# Patient Record
Sex: Female | Born: 1960 | Race: Black or African American | Hispanic: No | Marital: Single | Smoking: Former smoker
Health system: Southern US, Community
[De-identification: ages and names within clinical notes are randomized; demographics above are authoritative.]

## PROBLEM LIST (undated history)

## (undated) DIAGNOSIS — Z973 Presence of spectacles and contact lenses: Secondary | ICD-10-CM

## (undated) DIAGNOSIS — T7840XA Allergy, unspecified, initial encounter: Secondary | ICD-10-CM

## (undated) DIAGNOSIS — Z86018 Personal history of other benign neoplasm: Secondary | ICD-10-CM

## (undated) DIAGNOSIS — E669 Obesity, unspecified: Secondary | ICD-10-CM

## (undated) DIAGNOSIS — Z972 Presence of dental prosthetic device (complete) (partial): Secondary | ICD-10-CM

## (undated) DIAGNOSIS — D649 Anemia, unspecified: Secondary | ICD-10-CM

## (undated) DIAGNOSIS — J45909 Unspecified asthma, uncomplicated: Secondary | ICD-10-CM

## (undated) DIAGNOSIS — Z789 Other specified health status: Secondary | ICD-10-CM

## (undated) HISTORY — DX: Presence of dental prosthetic device (complete) (partial): Z97.2

## (undated) HISTORY — DX: Personal history of other benign neoplasm: Z86.018

## (undated) HISTORY — DX: Unspecified asthma, uncomplicated: J45.909

## (undated) HISTORY — DX: Obesity, unspecified: E66.9

## (undated) HISTORY — PX: OTHER SURGICAL HISTORY: SHX169

## (undated) HISTORY — PX: TONSILECTOMY, ADENOIDECTOMY, BILATERAL MYRINGOTOMY AND TUBES: SHX2538

## (undated) HISTORY — DX: Allergy, unspecified, initial encounter: T78.40XA

## (undated) HISTORY — PX: TUBAL LIGATION: SHX77

## (undated) HISTORY — DX: Other specified health status: Z78.9

## (undated) HISTORY — DX: Presence of spectacles and contact lenses: Z97.3

## (undated) HISTORY — DX: Anemia, unspecified: D64.9

---

## 1983-12-10 DIAGNOSIS — Z789 Other specified health status: Secondary | ICD-10-CM

## 1983-12-10 HISTORY — DX: Other specified health status: Z78.9

## 1998-11-08 HISTORY — PX: MYOMECTOMY: SHX85

## 1999-09-21 ENCOUNTER — Other Ambulatory Visit: Admission: RE | Admit: 1999-09-21 | Discharge: 1999-09-21 | Payer: Self-pay | Admitting: Obstetrics & Gynecology

## 1999-10-18 ENCOUNTER — Ambulatory Visit (HOSPITAL_COMMUNITY): Admission: RE | Admit: 1999-10-18 | Discharge: 1999-10-18 | Payer: Self-pay | Admitting: Obstetrics & Gynecology

## 1999-10-18 ENCOUNTER — Encounter: Payer: Self-pay | Admitting: Obstetrics & Gynecology

## 2000-02-08 ENCOUNTER — Ambulatory Visit (HOSPITAL_COMMUNITY): Admission: RE | Admit: 2000-02-08 | Discharge: 2000-02-08 | Payer: Self-pay | Admitting: Gastroenterology

## 2000-10-22 ENCOUNTER — Other Ambulatory Visit: Admission: RE | Admit: 2000-10-22 | Discharge: 2000-10-22 | Payer: Self-pay | Admitting: Obstetrics and Gynecology

## 2000-10-31 ENCOUNTER — Encounter: Payer: Self-pay | Admitting: Obstetrics and Gynecology

## 2000-10-31 ENCOUNTER — Ambulatory Visit (HOSPITAL_COMMUNITY): Admission: RE | Admit: 2000-10-31 | Discharge: 2000-10-31 | Payer: Self-pay | Admitting: Obstetrics and Gynecology

## 2001-03-20 DIAGNOSIS — Z789 Other specified health status: Secondary | ICD-10-CM

## 2001-03-20 HISTORY — DX: Other specified health status: Z78.9

## 2001-10-22 ENCOUNTER — Other Ambulatory Visit: Admission: RE | Admit: 2001-10-22 | Discharge: 2001-10-22 | Payer: Self-pay | Admitting: Obstetrics and Gynecology

## 2001-11-20 ENCOUNTER — Encounter: Payer: Self-pay | Admitting: Obstetrics and Gynecology

## 2001-11-20 ENCOUNTER — Ambulatory Visit (HOSPITAL_COMMUNITY): Admission: RE | Admit: 2001-11-20 | Discharge: 2001-11-20 | Payer: Self-pay | Admitting: Obstetrics and Gynecology

## 2002-11-02 ENCOUNTER — Other Ambulatory Visit: Admission: RE | Admit: 2002-11-02 | Discharge: 2002-11-02 | Payer: Self-pay | Admitting: Obstetrics and Gynecology

## 2002-11-23 ENCOUNTER — Ambulatory Visit (HOSPITAL_COMMUNITY): Admission: RE | Admit: 2002-11-23 | Discharge: 2002-11-23 | Payer: Self-pay | Admitting: Obstetrics and Gynecology

## 2002-11-23 ENCOUNTER — Encounter: Payer: Self-pay | Admitting: Obstetrics and Gynecology

## 2003-11-30 ENCOUNTER — Other Ambulatory Visit: Admission: RE | Admit: 2003-11-30 | Discharge: 2003-11-30 | Payer: Self-pay | Admitting: Obstetrics and Gynecology

## 2003-12-08 ENCOUNTER — Ambulatory Visit (HOSPITAL_COMMUNITY): Admission: RE | Admit: 2003-12-08 | Discharge: 2003-12-08 | Payer: Self-pay | Admitting: Obstetrics and Gynecology

## 2004-12-04 ENCOUNTER — Other Ambulatory Visit: Admission: RE | Admit: 2004-12-04 | Discharge: 2004-12-04 | Payer: Self-pay | Admitting: Obstetrics and Gynecology

## 2004-12-17 ENCOUNTER — Ambulatory Visit (HOSPITAL_COMMUNITY): Admission: RE | Admit: 2004-12-17 | Discharge: 2004-12-17 | Payer: Self-pay | Admitting: Obstetrics and Gynecology

## 2005-12-04 ENCOUNTER — Other Ambulatory Visit: Admission: RE | Admit: 2005-12-04 | Discharge: 2005-12-04 | Payer: Self-pay | Admitting: Obstetrics and Gynecology

## 2005-12-24 ENCOUNTER — Ambulatory Visit (HOSPITAL_COMMUNITY): Admission: RE | Admit: 2005-12-24 | Discharge: 2005-12-24 | Payer: Self-pay | Admitting: Obstetrics and Gynecology

## 2006-01-17 DIAGNOSIS — Z789 Other specified health status: Secondary | ICD-10-CM

## 2006-01-17 HISTORY — DX: Other specified health status: Z78.9

## 2006-12-30 ENCOUNTER — Ambulatory Visit (HOSPITAL_COMMUNITY): Admission: RE | Admit: 2006-12-30 | Discharge: 2006-12-30 | Payer: Self-pay | Admitting: Obstetrics and Gynecology

## 2007-01-10 ENCOUNTER — Emergency Department (HOSPITAL_COMMUNITY): Admission: EM | Admit: 2007-01-10 | Discharge: 2007-01-10 | Payer: Self-pay | Admitting: Emergency Medicine

## 2007-01-13 ENCOUNTER — Encounter: Admission: RE | Admit: 2007-01-13 | Discharge: 2007-01-13 | Payer: Self-pay | Admitting: Obstetrics and Gynecology

## 2007-03-12 ENCOUNTER — Ambulatory Visit: Payer: Self-pay | Admitting: Family Medicine

## 2007-04-15 ENCOUNTER — Ambulatory Visit: Payer: Self-pay | Admitting: Family Medicine

## 2008-01-19 ENCOUNTER — Encounter: Admission: RE | Admit: 2008-01-19 | Discharge: 2008-01-19 | Payer: Self-pay | Admitting: Obstetrics and Gynecology

## 2008-04-08 ENCOUNTER — Encounter (INDEPENDENT_AMBULATORY_CARE_PROVIDER_SITE_OTHER): Payer: Self-pay | Admitting: Obstetrics and Gynecology

## 2008-04-08 ENCOUNTER — Ambulatory Visit (HOSPITAL_COMMUNITY): Admission: RE | Admit: 2008-04-08 | Discharge: 2008-04-08 | Payer: Self-pay | Admitting: Obstetrics and Gynecology

## 2009-01-20 ENCOUNTER — Encounter: Admission: RE | Admit: 2009-01-20 | Discharge: 2009-01-20 | Payer: Self-pay | Admitting: Obstetrics and Gynecology

## 2009-08-01 ENCOUNTER — Ambulatory Visit: Payer: Self-pay | Admitting: Family Medicine

## 2009-10-12 ENCOUNTER — Ambulatory Visit: Payer: Self-pay | Admitting: Family Medicine

## 2009-10-13 ENCOUNTER — Encounter: Admission: RE | Admit: 2009-10-13 | Discharge: 2009-10-13 | Payer: Self-pay | Admitting: Family Medicine

## 2010-01-23 ENCOUNTER — Encounter: Admission: RE | Admit: 2010-01-23 | Discharge: 2010-01-23 | Payer: Self-pay | Admitting: Obstetrics and Gynecology

## 2010-03-09 HISTORY — PX: COLONOSCOPY: SHX174

## 2010-03-26 LAB — HM COLONOSCOPY

## 2010-10-30 ENCOUNTER — Ambulatory Visit: Payer: Self-pay | Admitting: Family Medicine

## 2010-12-06 ENCOUNTER — Ambulatory Visit: Payer: Self-pay | Admitting: Family Medicine

## 2010-12-30 ENCOUNTER — Encounter: Payer: Self-pay | Admitting: Obstetrics and Gynecology

## 2010-12-31 ENCOUNTER — Other Ambulatory Visit: Payer: Self-pay | Admitting: Obstetrics and Gynecology

## 2010-12-31 DIAGNOSIS — Z1239 Encounter for other screening for malignant neoplasm of breast: Secondary | ICD-10-CM

## 2011-01-09 LAB — HM MAMMOGRAPHY: HM Mammogram: NORMAL

## 2011-01-29 ENCOUNTER — Ambulatory Visit
Admission: RE | Admit: 2011-01-29 | Discharge: 2011-01-29 | Disposition: A | Discharge status: Home / Self Care | Payer: BC Managed Care – PPO | Source: Ambulatory Visit | Attending: Obstetrics and Gynecology | Admitting: Obstetrics and Gynecology

## 2011-01-29 DIAGNOSIS — Z1239 Encounter for other screening for malignant neoplasm of breast: Secondary | ICD-10-CM

## 2011-04-08 ENCOUNTER — Encounter (INDEPENDENT_AMBULATORY_CARE_PROVIDER_SITE_OTHER): Payer: BC Managed Care – PPO | Admitting: Medical

## 2011-04-08 DIAGNOSIS — L989 Disorder of the skin and subcutaneous tissue, unspecified: Secondary | ICD-10-CM

## 2011-04-08 DIAGNOSIS — E669 Obesity, unspecified: Secondary | ICD-10-CM

## 2011-04-08 DIAGNOSIS — Z0000 Encounter for general adult medical examination without abnormal findings: Secondary | ICD-10-CM

## 2011-04-10 ENCOUNTER — Encounter: Payer: Self-pay | Admitting: Medical

## 2011-04-10 ENCOUNTER — Encounter: Payer: BC Managed Care – PPO | Admitting: *Deleted

## 2011-04-23 NOTE — Op Note (Signed)
[name], [name]              ACCOUNT NO.:  [id number]   MEDICAL RECORD NO.:  [id number]          PATIENT TYPE:  AMB   LOCATION:  SDC                           FACILITY:  [facility]   PHYSICIAN:  Naima A. Dillard, M.D. DATE OF BIRTH:  March 12, 1961   DATE OF PROCEDURE:  04/08/2008  DATE OF DISCHARGE:                               OPERATIVE REPORT   TIME SEEN:  1 p.m.   PREOPERATIVE DIAGNOSES:  1. Metrorrhagia.  2. Uterine masses.   POSTOPERATIVE DIAGNOSES:  1. Metrorrhagia.  2. Uterine fibroid, which is submucosal.  3. Endometrial polyp.   PROCEDURE:  1. D&C.  2. Hysteroscopy.  3. Polypectomy.  4. Hysteroscopic resection of fibroid.   SURGEON:  Naima A. Dillard, MD.   ASSISTANT:  None .   ANESTHESIA:  MAC and local.   FINDINGS:  A small submucosal fibroid in the anterior wall of the  uterus.  Four of these polyp on the patient's right side wall of the  uterus, abundant fluffy endometrium.   SPECIMENS:  Endometrial curettings, polyp, and fragments of uterine  fibroid sent to pathology.   ESTIMATED BLOOD LOSS:  Minimal.   COMPLICATIONS:  None.   The patient went to PACU in stable condition.   Hysteroscopic deficit was 270 for the fluid, but there was plenty on the  floor and underneath the patient at the end of the procedure.   PROCEDURE IN DETAIL:  The patient was taken to the operating room where  she was given MAC anesthesia, placed in dorsal lithotomy position,  prepped and draped in a normal sterile fashion.  A bivalve speculum was  placed into the vagina.  The anterior lip of the cervix was grasped with  a single-tooth tenaculum.  The cervix was infiltrated with 20 mL of 1%  lidocaine and used for cervical block.  Cervix was then dilated with  Pratt dilators up to 21.  The hysteroscope was placed into the uterine  cavity.  Both ostia were visualized and the findings noted above were  seen.  The cervix was further dilated up to 31.  The resectoscope was  placed  into the uterine cavity.  The broad-based polyp was first removed  using cut and sent to pathology.  Then, I was able to see the entire  fibroid, which was removed on cut with a single loop.  Then, a sharp  curettage was done.  A second look was done and there were no  perforations noted.  No further polyps.  All specimens were sent to  pathology.  All instruments were removed from the vagina and cervix.  The tenaculum site on the patient's left side was made hemostatic with  silver nitrate.  On the left, I used silver nitrate, but it continued to  bleed, so I used a figure-of-eight stitch of 2-0 Vicryl on the UR-6 to  stop the bleeding.  Sponge, lap, and needle counts were correct.  The  patient went to recovery room in stable condition.      Naima A. Normand Sloop, M.D.  Electronically Signed     NAD/MEDQ  D:  04/08/2008  T:  04/09/2008  Job:  [id number]

## 2011-04-23 NOTE — H&P (Signed)
[name], [name]              ACCOUNT NO.:  [id number]   MEDICAL RECORD NO.:  [id number]          PATIENT TYPE:  AMB   LOCATION:  SDC                           [facility]ILITY:  [facility]   PHYSICIAN:  Naima A. Dillard, M.D. DATE OF BIRTH:  13-May-1961   DATE OF ADMISSION:  04/08/2008  DATE OF DISCHARGE:                              HISTORY & PHYSICAL   The patient is a 50 year old female, gravida 3, para 2, who came in for  her annual and complained of having irregular bleeding in between her  periods.  The patient does have a history of fibroids.  She is not on  any contraception.  She denies any new medications, menopausal symptoms,  or vaginal discharge.  Occasional pelvic pain.  No abdominal pain and no  increased stress.  The patient had an endometrial biopsy, which was  significant for a polyp.  She did have a sonohysterogram, which showed a  uterus measuring 12 x 11 x 9 with several fibroids.  The sonohysterogram  showed 4 polyps noted:  2.1 cm, 1.6 cm, 2.2 cm, and 2.5 cm.  Endometrial  biopsy was benign.  The patient has decided to have a D&C, hysteroscopy,  removal of polyps, and just do observation for the fibroids.  The  patient denies having any bleeding disorders or thyroid disease.   PAST MEDICAL HISTORY:  As above.   PAST SURGICAL HISTORY:  Significant for tubal ligation and abdominal  myomectomy.   FAMILY HISTORY:  Significant for heart disease and high blood pressure  and diabetes in maternal grandfather.   SOCIAL HISTORY:  Negative for tobacco, alcohol, or drug use.   ALLERGIES:  The patient is allergic to SHELLFISH and ASPIRIN.   MEDICATIONS:  Include Ambien as needed and calcium.   REVIEW OF SYSTEMS:  CARDIOVASCULAR:  No heart palpitations.  MUSCULOSKELETAL:  No weakness.  GENITOURINARY:  Significant for  irregular bleeding.  ENDOCRINE:  No thyroid disorder.   PHYSICAL EXAMINATION:  The patient's blood pressure is 112/70, she  weighs 231 pounds, and her height is  5 feet 4-1/2 inches.  Pupils are equal.  Hearing is normal.  Throat is clear.  Thyroid is not  enlarged.  HEART:  Regular rate and rhythm.  LUNGS:  Clear to auscultation bilaterally.  ABDOMEN:  Nondistended and soft.  No masses or organomegaly.  BREASTS:  No masses, discharge, skin change, or nipple retraction  bilaterally.  EXTREMITIES:  No cyanosis, clubbing, or edema.  NEURO:  Within normal limits.  Vaginal exam within normal limits.  Cervix is nontender without any  lesions.  Uterus is 12-week size, but she cannot feel irregularity on  exam and nontender.  Adnexa has no masses.   PERTINENT LABORATORY DATA:  Pap smear is within normal limits.  Endometrial biopsy was benign with polyps.  Sonohysterogram is as above.   The patient has metrorrhagia.  The patient desires a dilation and  curettage and hysteroscopy with removal of polyps.  She desires  observation for fibroids.  All treatment for fibroids were reviewed with  the patient.  The patient understands the risks but not limited  to  bleeding, infection, and damage to internal organs such as bowel,  bladder, and major blood vessels.      Naima A. Normand Sloop, M.D.  Electronically Signed     NAD/MEDQ  D:  04/07/2008  T:  04/08/2008  Job:  [id number]

## 2011-04-26 NOTE — Procedures (Signed)
[facility]  Patient:    [name], [name]                     MRN: [id number] Proc. Date: 02/09/00 Adm. Date:  [id number] Attending:  Nelda Marseille CC:         Petra Kuba, M.D.             Ronnald Nian, M.D.                           Procedure Report  PROCEDURE:  Colonoscopy.  INDICATIONS:  Bright red blood, clots, and periodic guaiac positivity.  INFORMED CONSENT:  Consent was signed after risks, benefits, methods, and options were thoroughly discussed in the office.  MEDICATIONS USED:  Demerol 60 and Versed 6.  DESCRIPTION OF PROCEDURE:  Rectal inspection was pertinent for tiny external hemorrhoids.  Digital examination was negative.  The video pediatric colonoscope was inserted and very easily advanced around the colon to the cecum.  This did ot require any position changes, but it did require some left lower quadrant pressure. On insertion, no obvious abnormalities were seen, including no heme.  The cecum was identified by the appendiceal orifice and the ileocecal valve.  In fact, the scope was inserted a short ways into the terminal ileum, which was facilitated by rolling her on her back.  The scope was slowly withdrawn.  The prep was adequate. There was some liquid stool that required washing and suctioning, but on slow withdrawal through the colon, no abnormalities were seen, specifically, no polyps, sign of  colitis, diverticula, AVMs, or any heme.  Once back in the rectum, the scope was retroflexed, revealing some internal hemorrhoids.  The scope was straightened and re-advanced a short ways of the sigmoid.  Air was suctioned, and the scope was removed.  The patient tolerated the procedure well.  There were no obvious or immediate complications.  ENDOSCOPIC DIAGNOSES: 1. Internal greater than external hemorrhoids. 2. Otherwise within normal limits to the terminal ileum without any heme being ee.  PLAN:  Go  ahead and treat hemorrhoids with Anusol-HC suppositories.  Call p.r.n., and otherwise, follow up in 6-8 weeks to recheck symptoms and guaiac to make sure no further workup plans are needed. DD:  02/08/00 TD:  02/09/00 Job: [id number]

## 2011-05-13 ENCOUNTER — Other Ambulatory Visit (INDEPENDENT_AMBULATORY_CARE_PROVIDER_SITE_OTHER): Payer: BC Managed Care – PPO

## 2011-05-13 DIAGNOSIS — D649 Anemia, unspecified: Secondary | ICD-10-CM

## 2011-05-13 LAB — CBC WITH DIFFERENTIAL/PLATELET
Basophils Absolute: 0 10*3/uL (ref 0.0–0.1)
Basophils Relative: 0 % (ref 0–1)
Eosinophils Absolute: 0.3 10*3/uL (ref 0.0–0.7)
HCT: 38.3 % (ref 36.0–46.0)
Hemoglobin: 12.1 g/dL (ref 12.0–15.0)
MCHC: 31.6 g/dL (ref 30.0–36.0)
MCV: 77.8 fL — ABNORMAL LOW (ref 78.0–100.0)
Monocytes Relative: 10 % (ref 3–12)
Neutro Abs: 4.8 10*3/uL (ref 1.7–7.7)
Neutrophils Relative %: 59 % (ref 43–77)
Platelets: 243 10*3/uL (ref 150–400)

## 2011-05-14 ENCOUNTER — Telehealth: Payer: Self-pay | Admitting: *Deleted

## 2011-05-14 ENCOUNTER — Ambulatory Visit: Payer: BC Managed Care – PPO

## 2011-05-14 NOTE — Telephone Encounter (Addendum)
Message copied by Dorthula Perfect on Tue May 14, 2011 11:26 AM ------      Message from: Jac Canavan      Created: Tue May 14, 2011  9:52 AM       Blood count improved.  Lets continue the iron for at least the next 3-5 months.  We can repeat labs then.     Pt notified of lab results.  Will continue iron and will call back in 3-5 months to repeat labs.  CM, LPN

## 2011-10-15 ENCOUNTER — Encounter: Payer: Self-pay | Admitting: Family Medicine

## 2012-01-31 ENCOUNTER — Encounter: Payer: Self-pay | Admitting: Medical

## 2012-01-31 ENCOUNTER — Ambulatory Visit (INDEPENDENT_AMBULATORY_CARE_PROVIDER_SITE_OTHER): Payer: BC Managed Care – PPO | Admitting: Medical

## 2012-01-31 VITALS — BP 140/80 | HR 88 | Temp 98.1°F | Resp 16 | Wt 186.0 lb

## 2012-01-31 DIAGNOSIS — D649 Anemia, unspecified: Secondary | ICD-10-CM

## 2012-01-31 DIAGNOSIS — R062 Wheezing: Secondary | ICD-10-CM

## 2012-01-31 DIAGNOSIS — R05 Cough: Secondary | ICD-10-CM

## 2012-01-31 DIAGNOSIS — R059 Cough, unspecified: Secondary | ICD-10-CM

## 2012-01-31 LAB — CBC WITH DIFFERENTIAL/PLATELET
Basophils Relative: 0 % (ref 0–1)
Eosinophils Absolute: 0.2 10*3/uL (ref 0.0–0.7)
Eosinophils Relative: 2 % (ref 0–5)
HCT: 39 % (ref 36.0–46.0)
Monocytes Absolute: 0.7 10*3/uL (ref 0.1–1.0)
Monocytes Relative: 10 % (ref 3–12)
Neutro Abs: 4.1 10*3/uL (ref 1.7–7.7)
Neutrophils Relative %: 56 % (ref 43–77)
WBC: 7.3 10*3/uL (ref 4.0–10.5)

## 2012-01-31 MED ORDER — AZITHROMYCIN 250 MG PO TABS: ORAL_TABLET | ORAL | Status: AC

## 2012-01-31 MED ORDER — ALBUTEROL SULFATE HFA 108 (90 BASE) MCG/ACT IN AERS: 2.0000 | INHALATION_SPRAY | Freq: Four times a day (QID) | RESPIRATORY_TRACT | Status: DC | PRN

## 2012-01-31 NOTE — Progress Notes (Signed)
Subjective:  [name] is a 51 y.o. female who presents for 2-3 wk hx/o cough, cough that seems long and drawn out, wheezing at times.  Has used some Mucinex, Whiskey, and Tylenol without much improvement.  She is a former smoker, 10 pack year hx/o but stopped 10 years ago.  She denies COPD or asthma. She exercises 2 days/wk without DOE or dyspnea or CP.  She has lost weight and continues with [employer] program.  Denies chest pain, palpitations or edema.  Thinks this may be bronchitis.  Getting frequency long cough.  +sick contacts.  She did get flu shot this year.  She denies hx/o DVT/PE, no recent proceudre, surgery, long travel, not on HRT, no calve pain, no hempotysis.  No other aggravating or relieving factors.  No other c/o.   The following portions of the patient's history were reviewed and updated as appropriate: allergies, current medications, past family history, past medical history, past social history, past surgical history and problem list.  Past Medical History  Diagnosis Date  . Anemia   . Obesity   . History of uterine fibroid    ROS Gen: no fever, chills, sweats HEENT: no sore throat, ear pain, sinus pressure Heart: no CP, palpitations, edema Lungs: no SOB, +wheezing, worse when flat GI: no NVD GU: negative   Objective:   Filed Vitals:   01/31/12 1418  BP: 140/80  Pulse: 88  Temp: 98.1 F (36.7 C)  Resp: 16    General appearance: Alert, WD/WN, no distress, mildly ill appearing                             Skin: warm, no rash, no diaphoresis                           Head: no sinus tenderness                            Eyes: conjunctiva normal, corneas clear, PERRLA                            Ears: pearly TMs, external ear canals normal                          Nose: septum midline, turbinates swollen, with erythema and clear discharge             Mouth/throat: MMM, tongue normal, mild pharyngeal erythema                           Neck: supple, no  adenopathy, no thyromegaly, nontender, no JVD                          Heart: RRR, normal S1, S2, no murmurs                         Lungs: +bronchial breath sounds, +scattered rhonchi, no wheezes, no rales                Extremities: no edema, nontender     Assessment and Plan:   Encounter Diagnoses  Name Primary?  . Cough Yes  . Wheezing    Will treat for bronchitis.  Prescription given today for Azithromycin and Albuterol inhaler as below.  Discussed diagnosis and treatment of bronchitis.  Suggested symptomatic OTC remedies for cough and congestion.  Advised she call back with symptom update by early next week.  Call/return in 2-3 days if symptoms are worse or not improving. We will send CXR for overread.

## 2012-01-31 NOTE — Patient Instructions (Signed)
Rest, drink plenty of fluids over the weekend, begin the antibiotic, and use the inhaler 2 puffs 2-3 times daily, and consider OTC Mucinex DM or Coricidin HBP to help with cough/congestion.  If not better by early next week, or if worse, then call or return.   Acute Bronchitis Bronchitis is a problem of the air tubes leading to your lungs. Acute means the illness started quickly. In this condition, the lining of those tubes becomes puffy (swollen) and can leak fluid. This makes it harder for air to get in and out of your lungs. You may cough a lot. This is because the air tubes are narrow. Bronchitis is most often caused by a virus. Medicines that kill germs (antibiotics) may be needed with germ (bacteria) infections for people who:  Smoke.   Have lasting (chronic) lung problems.   Are elderly.  HOME CARE  Rest.   Drink enough water and fluids to keep the pee clear or pale yellow.   Only take medicine as told by your doctor.   Medicines may be prescribed that will open up the airways. This will help make breathing easier.   Bronchitis usually gets better on its own in a few days.  Recovery from some problems (symptoms) of bronchitis may be slow. You should start feeling a little better after 2 to 3 days. Coughing may last for 3 to 4 weeks. GET HELP RIGHT AWAY IF:  You or your child has a temperature by mouth above 101, not controlled by medicine.   Chills or chest pain develops.   You or your child develops very bad shortness of breath.   There is bloody saliva mixed with mucus (sputum).   You or your child throws up (vomits) often, loses too much fluid (dehydration), feels faint, or has a very bad headache.   You or your child does not improve after 1 week of treatment.  MAKE SURE YOU:   Understand these instructions.   Will watch this condition.   Will get help right away if you or your child is not doing well or gets worse.  Document Released: 05/13/2008 Document  Re-Released: 02/19/2010 [facility] Patient Information 2011 [place], [place].

## 2012-02-04 ENCOUNTER — Ambulatory Visit (INDEPENDENT_AMBULATORY_CARE_PROVIDER_SITE_OTHER): Payer: BC Managed Care – PPO | Admitting: Obstetrics and Gynecology

## 2012-02-04 ENCOUNTER — Other Ambulatory Visit: Payer: Self-pay | Admitting: Obstetrics and Gynecology

## 2012-02-04 DIAGNOSIS — Z01419 Encounter for gynecological examination (general) (routine) without abnormal findings: Secondary | ICD-10-CM

## 2012-02-04 DIAGNOSIS — D259 Leiomyoma of uterus, unspecified: Secondary | ICD-10-CM

## 2012-02-04 DIAGNOSIS — Z1231 Encounter for screening mammogram for malignant neoplasm of breast: Secondary | ICD-10-CM

## 2012-02-11 ENCOUNTER — Ambulatory Visit
Admission: RE | Admit: 2012-02-11 | Discharge: 2012-02-11 | Disposition: A | Discharge status: Home / Self Care | Payer: BC Managed Care – PPO | Source: Ambulatory Visit | Attending: Obstetrics and Gynecology | Admitting: Obstetrics and Gynecology

## 2012-02-11 DIAGNOSIS — Z1231 Encounter for screening mammogram for malignant neoplasm of breast: Secondary | ICD-10-CM

## 2012-03-03 ENCOUNTER — Other Ambulatory Visit: Payer: Self-pay | Admitting: Obstetrics and Gynecology

## 2012-03-16 ENCOUNTER — Encounter (HOSPITAL_COMMUNITY): Payer: Self-pay | Admitting: Pharmacist

## 2012-03-25 ENCOUNTER — Encounter (HOSPITAL_COMMUNITY)
Admission: RE | Admit: 2012-03-25 | Discharge: 2012-03-25 | Disposition: A | Discharge status: Home / Self Care | Payer: BC Managed Care – PPO | Source: Ambulatory Visit | Attending: Obstetrics and Gynecology | Admitting: Obstetrics and Gynecology

## 2012-03-25 ENCOUNTER — Encounter (HOSPITAL_COMMUNITY): Payer: Self-pay

## 2012-03-25 LAB — BASIC METABOLIC PANEL
BUN: 14 mg/dL (ref 6–23)
Calcium: 10 mg/dL (ref 8.4–10.5)
Creatinine, Ser: 0.62 mg/dL (ref 0.50–1.10)
GFR calc Af Amer: >90 mL/min (ref >90)
GFR calc non Af Amer: >90 mL/min (ref >90)
Potassium: 3.9 mEq/L (ref 3.5–5.1)

## 2012-03-25 LAB — SURGICAL PCR SCREEN: MRSA, PCR: NEGATIVE

## 2012-03-25 LAB — CBC
Hemoglobin: 13.1 g/dL (ref 12.0–15.0)
MCV: 83 fL (ref 78.0–100.0)
WBC: 7.7 10*3/uL (ref 4.0–10.5)

## 2012-03-25 NOTE — Patient Instructions (Addendum)
20 [name]  03/25/2012   Your procedure is scheduled on:  04/08/12  Enter through the Main Entrance of [facility] at 6 AM.  Pick up the phone at the desk and dial [phone].   Call this number if you have problems the morning of surgery: [phone]   Remember:   Do not eat food:After Midnight.  Do not drink clear liquids: After Midnight.  Take these medicines the morning of surgery with A SIP OF WATER: na   Do not wear jewelry, make-up or nail polish.  Do not wear lotions, powders, or perfumes. You may wear deodorant.  Do not shave 48 hours prior to surgery.  Do not bring valuables to the hospital.  Contacts, dentures or bridgework may not be worn into surgery.  Leave suitcase in the car. After surgery it may be brought to your room.  For patients admitted to the hospital, checkout time is 11:00 AM the day of discharge.   Patients discharged the day of surgery will not be allowed to drive home.  Name and phone number of your driver: na  Special Instructions: CHG Shower Use Special Wash: 1/2 bottle night before surgery and 1/2 bottle morning of surgery.   Please read over the following fact sheets that you were given: MRSA Information

## 2012-03-27 ENCOUNTER — Ambulatory Visit (INDEPENDENT_AMBULATORY_CARE_PROVIDER_SITE_OTHER): Payer: BC Managed Care – PPO | Admitting: Obstetrics and Gynecology

## 2012-03-27 ENCOUNTER — Encounter: Payer: Self-pay | Admitting: Obstetrics and Gynecology

## 2012-03-27 VITALS — BP 122/74 | HR 74 | Temp 97.8°F | Resp 16 | Ht 64.5 in | Wt 184.0 lb

## 2012-03-27 DIAGNOSIS — D259 Leiomyoma of uterus, unspecified: Secondary | ICD-10-CM

## 2012-03-27 DIAGNOSIS — N920 Excessive and frequent menstruation with regular cycle: Secondary | ICD-10-CM

## 2012-03-27 DIAGNOSIS — E669 Obesity, unspecified: Secondary | ICD-10-CM

## 2012-03-27 DIAGNOSIS — Z8742 Personal history of other diseases of the female genital tract: Secondary | ICD-10-CM

## 2012-03-27 DIAGNOSIS — R0602 Shortness of breath: Secondary | ICD-10-CM

## 2012-03-27 DIAGNOSIS — Z86018 Personal history of other benign neoplasm: Secondary | ICD-10-CM

## 2012-03-27 DIAGNOSIS — Z01818 Encounter for other preprocedural examination: Secondary | ICD-10-CM

## 2012-03-27 DIAGNOSIS — D649 Anemia, unspecified: Secondary | ICD-10-CM

## 2012-03-27 DIAGNOSIS — D219 Benign neoplasm of connective and other soft tissue, unspecified: Secondary | ICD-10-CM

## 2012-03-27 DIAGNOSIS — N840 Polyp of corpus uteri: Secondary | ICD-10-CM

## 2012-03-27 NOTE — Progress Notes (Deleted)
[name] is a 51 y.o. female [phone] who presents for Robot Assisted Myomectomy with hysteroscopic resection of endometrial polyps and Novasure Endometrial Ablation because of menorrhagia, endometrial polyps and symptomatic uterine fibroids.  Patient has a long h/o fibroids and underwent a myomectomy in 1999.  In recent years patient has begun to bleed 15 days out of the month requiring the change of an overnight pad along with tissue paper every 3 hours.  Patient admits to occasionally soling clothing & linen. She goes on to report cramping rated as 9/10 on a 10 point pain scale that is decreased to 4/10 with Tylenol.  Sometimes after her 15 day bleed she will experience 3 days of spotting 9 days later. She is sometimes lightheaded but denies vaginitis sx, uti sx, or changes in BM.  U/S with SHG results from 11/10/2011 showed: uterus measuring 11.16 x 8.23 x 7.42 cm; Five fibroids: posterior subserosal-6.5 x 5.6 x 7.1 cm, posterior subserosal-3.7 x 3.4 x 4.4cm, anterior intramural-1.8 x 2.1 x 2.1cm, posterior intramural-2.2 x 2.0 x 2.6cm, and fundal-2.5 x 2.8 x 2.4cm. SHG-complex endometrium, endometrial polyp-1.3 x 0.67cm, there is a suspected mass in the inferior portion of the endometrium ? fibroid-5.2 x 1.3cm, per 3D rendering. Right ovary: 3.32 x 1.49 x 1.62cm and Left ovary: 2.54 x 1.43 x 1.58cm.  Past Medical History  OB History: G3P3003 {Delivery:20192}  GYN History: menarche    LMP    Contracepton abstinenceno method   The patient denies history of sexually transmitted disease.  history of laser surgery on cervix 1984 Last PAP smear  Medical History:   Surgical History: Denies problems with anesthesia or history of blood transfusions  Family History:  Social History:   *** :: {Desc; social history:32080}   @Medications @  @allergies @  ROS:{dnt review of systems (ROS):20055}  Physical Exam    BP 122/74  Pulse 74  Temp 97.8 F (36.6 C)  Resp 16  Ht 5' 4.5" (1.638 m)   Wt 184 lb (83.462 kg)  BMI 31.10 kg/m2  LMP 03/23/2012 weight  .[id number]   BMI Body mass index is 31.10 kg/(m^2).  Neck: {EXAM; NECK PED:18560} Lungs: {Exam; lungs brief:12271} Abdomen: {Exam; abdomen:5259::"abdomen is soft without significant tenderness, masses, organomegaly or guarding"} Extremities: Pelvic:uterus 12-14 weeks size, irregular, tender, cervix displaced to right, vagina with scant blood & atrophic and adnexae without tenderness or masses   Assesment: Menorrhagia Uterine Fibroids Endometrial Polyps  Disposition:  A discussion was held with patient regarding the indication for her procedure(s) along with the risks, which include but are not limited to: reaction to anesthesia, damage to adjacent organs, infection and excessive bleeding and possible need for an open abdominal incision.  Patient also understands that the robotic approach to her surgery requires more time than that of an open abdominal incision, that she will experience transient post operative facial edema, that her hospital stay is expected to be 0-2 days and return to regular activities in 2-3 weeks (wtih the exception of intercourse which will be 6 weeks).  The patient was given the Miralax bowel prep to be completed 24 hours prior to her surgery.  The patient verbalized understanding of these risks and pre-operative instructions and has consented to proceed with a Robot Assisted Myomectomy, Hysteroscopy with D & C followed by Novasure Endometrial Ablation at [facility] [place], Apr 08, 2012 at 7:30 a.m.  CSN# [id number]   Awa Bachicha J. Lowell Guitar, PA-C  for Dr. Estanislado Pandy

## 2012-03-27 NOTE — Progress Notes (Signed)
[name] is a 51 y.o. single African-American female G3 P2-0-1-3 who presents for a robot assisted myomectomy with hysteroscopic resection of endometrial polyps and Novasure endometrial ablation because of menorrhagia, endometrial polyps and symptomatic uterine fibroids. Patient has a long history of fibroids and underwent a myomectomy in 1999. In recent years the patient began bleeding 15 days out of the month requiring the change of an overnight pad, along with tissue paper, every 3 hours. Patient admits to occasionally soiling clothing & linen. She goes on to report cramping rated as 9/10 (on a 10 point pain scale) that is decreased to 4/10 with Tylenol. Occasionally, nine days after her 15 day bleed, she will experience 3 days of spotting. She admits to episodes of lightheadedness, but denies vaginitis symptoms, uti symptoms, or changes in BM. Patient is not currently sexually active.   U/S with SHG results from 11/10/2011 showed: uterus measuring 11.16 x 8.23 x 7.42 cm; Five fibroids: posterior subserosal-6.5 x 5.6 x 7.1 cm, posterior subserosal-3.7 x 3.4 x 4.4cm, anterior intramural-1.8 x 2.1 x 2.1cm, posterior intramural-2.2 x 2.0 x 2.6cm, and fundal-2.5 x 2.8 x 2.4cm.  SHG-complex endometrium, endometrial polyp-1.3 x 0.67cm, there is a suspected mass in the inferior portion of the endometrium ? fibroid-5.2 x 1.3cm, per 3D rendering.  Right ovary: 3.32 x 1.49 x 1.62cm and Left ovary: 2.54 x 1.43 x 1.58cm.   Endometrial biopsy (11/2011) did not demonstrate any hyperplasia, atypia or malignancy but mixed endometrial/endocervical polyp. Though patient was offered medical management for her symptoms, she has decided to proceed with a repeat myomectomy and endometrial ablation.   Past Medical History  OB History: G3 P 2-0-1-3 SVD 1985 and 1987   GYN History: menarche 51 years old LMP 03/23/12 Contracepton-abstinence, The patient denies history of sexually transmitted disease., has a history of  laser surgery on cervix in 1984 with normalt PAP smears since Last PAP smear 01/2012.   Medical History: anemia, insomnia, rectal bleeding with benign colon polyps    Surgical History: 1972 Tonsillectomy; 1987-Bilateral Tubal Ligation; 1999-Myomectomy; 2009-Hysteroscopy with D & C for polyps. Denies problems with anesthesia or history of blood transfusions   Family History: Hypertension, Diabetes, End-Stage Renal Disease, Breast Cancer (maternal grand-mother age 78), Cardio-Vascular Disease and Seizures   Social History: Patient is single and works as a counselor at [employer]   Habits: former cigarette smoker (quit 20 years ago), occasionally uses alcohol but denies illicit drug use.   Medications:  Multivitamins 1/day  Calcium 950 mg 1day  Ferrous Sulfate 325 mg twice daily   Allergies: ASA & Shellfish cause hives. Denies sensitivity to peanuts, soy,or latex   ROS:Patient reports no health concerns. wears glasses, has partial dental plates in upper and lower jaws, denies chest pain, shortness of breath, headache, vision changes, difficulty swallowing, nausea, vomiting, diarrhea, arthralgias or myalgias.   Physical Exam   BP 122/74  Pulse 74  Temp 97.8 F (36.6 C)  Resp 16  Ht 5' 4.5" (1.638 m)  Wt 184 lb (83.462 kg)  BMI 31.10 kg/m2  LMP 03/23/2012  BMI Body mass index is 31.10 kg/(m^2).  Neck: Neck supple. No adenopathy. Thyroid symmetric, normal size  Heart: Regular rate and rhythm.  Lungs: clear  Abdomen: abdomen is soft without tenderness or organomegaly; firm palpable mass from the pelvis to approximately 2 fingers above symphysis  Extremities: no clubbing cyanosis or edema  Pelvic:uterus 12-14 weeks size, irregular, tender, cervix displaced to right, vagina with scant blood &   atrophic and adnexae without tenderness or masses   Assesment:  1. Menorrhagia  2. Uterine Fibroids  3. Endometrial Polyps   Disposition: A discussion was held with patient  regarding the indication for her procedure(s) along with the risks, which include, but are not limited to: reaction to anesthesia, damage to adjacent organs, infection and excessive bleeding and possible need for an open abdominal incision. Patient also understands that the robotic approach to her surgery requires more time than that of an open abdominal incision, that she will experience transient post-operative facial edema, that her hospital stay is expected to be 0-2 days with return to regular activities in 2-3 weeks (except for intercourse which will be 6 weeks). The patient was given the Miralax bowel prep to be completed 24 hours prior to her surgery. The patient verbalized understanding of these risks and pre-operative instructions and has consented to proceed with a robot assisted myomectomy, hysteroscopy with D & C followed by Novasure endometrial ablation at [facility] [place], Apr 08, 2012 at 7:30 a.m.   CSN# [id number]   Icarus Partch J. Ronald Londo, PA-C for Dr. Rivard   

## 2012-03-27 NOTE — Patient Instructions (Signed)
Follow instructions given for your Miralax Bowel Prep starting the day before your surgery.

## 2012-03-27 NOTE — Progress Notes (Signed)
See pre-op visit with Henreitta Leber PA-C.

## 2012-03-27 NOTE — H&P (Deleted)
[name] is a 51 y.o. female [phone] who presents for a robot assisted myomectomy with hysteroscopic resection of endometrial polyps and Novasure endometrial ablation because of menorrhagia, endometrial polyps and symptomatic uterine fibroids.  Patient has a long h/o fibroids and underwent a myomectomy in 1999.  In recent years patient has begun to bleed 15 days out of the month requiring the change of an overnight pad along with tissue paper every 3 hours.  Patient admits to occasionally soling clothing & linen. She goes on to report cramping rated as 9/10 on a 10 point pain scale that is decreased to 4/10 with Tylenol.  Sometimes after her 15 day bleed she will experience 3 days of spotting 9 days later. She is sometimes lightheaded but denies vaginitis sx, uti sx, or changes in BM. Patient is not currently sexually active.  U/S with SHG results from 11/10/2011 showed: uterus measuring 11.16 x 8.23 x 7.42 cm; Five fibroids: posterior subserosal-6.5 x 5.6 x 7.1 cm, posterior subserosal-3.7 x 3.4 x 4.4cm, anterior intramural-1.8 x 2.1 x 2.1cm, posterior intramural-2.2 x 2.0 x 2.6cm, and fundal-2.5 x 2.8 x 2.4cm. SHG-complex endometrium, endometrial polyp-1.3 x 0.67cm, there is a suspected mass in the inferior portion of the endometrium ? fibroid-5.2 x 1.3cm, per 3D rendering. Right ovary: 3.32 x 1.49 x 1.62cm and Left ovary: 2.54 x 1.43 x 1.58cm.  Endometrial biopsy (11/2011) did not demonstrate any hyperplasia, atypia or malignancy but mixed endometrial/endocervical polyp.  Though patient was offered medical management for her symptoms, she has decided to proceed with a repeat myomectomy and endometrial ablation.   Past Medical History  OB History: G3  P  2-0-1-3   SVD  1985 and 1987  GYN History: menarche  51 years old   LMP 03/23/12    Contracepton-abstinence,    The patient denies history of sexually transmitted disease., has a  history of laser surgery on cervix in 1984 but PAP smears have  been normal since Last PAP smear 01/2012  Medical History: anemia, insomnia, rectal bleeding with benign colon polyps  Surgical History:  1972- Tonsillectomy; 1987-Bilateral Tubal Ligation; 1999-Myomectomy; 2009-Hysteroscopy with D & C for polyps. 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  Medications: Multivitamins 1/day Calcium 950 mg  1day Ferrous Sulfate 325 mg twice daily  Allergies:  ASA & Shellfish cause hives.  Denies sensitivity to peanuts, soy,or latex  [id number] reports no health concerns.  Physical Exam    BP 122/74  Pulse 74  Temp 97.8 F (36.6 C)  Resp 16  Ht 5' 4.5" (1.638 m)  Wt 184 lb (83.462 kg)  BMI 31.10 kg/m2  LMP 03/23/2012 weight  .[id number]   BMI Body mass index is 31.10 kg/(m^2).  Neck: Neck supple. No adenopathy. Thyroid symmetric, normal size. Lungs: clear Abdomen: abdomen is soft without tenderness or organomegaly; firm palpable mass from the pelvis to approximately 2  fingers above symphysis Extremities: no clubbing cyanosis or edema Pelvic:uterus 12-14 weeks size, irregular, tender, cervix displaced to right, vagina with scant blood & atrophic and adnexae without tenderness or masses  Assesment: 1. Menorrhagia 2. Uterine Fibroids 3. Endometrial Polyps  Disposition:  A discussion was held with  patient regarding the indication for her procedure(s) along with the risks, which include, but are not limited to: reaction to anesthesia, damage to adjacent organs, infection and excessive bleeding and possible need for an open abdominal incision.  Patient also understands that the  robotic approach to her surgery requires more time than that of an open abdominal incision, that she will experience transient post-operative facial edema, that her hospital stay is expected to be 0-2 days and return to regular activities in 2-3 weeks (wtih the exception of intercourse which will be 6 weeks).  The patient was given the Miralax bowel prep to be completed 24 hours prior to her surgery.  The patient verbalized understanding of these risks and pre-operative instructions and has consented to proceed with a robot assisted myomectomy, hysteroscopy with D & C followed by Novasure endometrial ablation at [facility] at [place], Apr 08, 2012 at 7:30 a.m.  CSN# [id number]   Quintell Bonnin J. Lowell Guitar, PA-C  for Dr. Estanislado Pandy

## 2012-03-29 NOTE — Progress Notes (Signed)
[name] is a 51 y.o. single African-American female G3 P2-0-1-3 who presents for a robot assisted myomectomy with hysteroscopic resection of endometrial polyps and Novasure endometrial ablation because of menorrhagia, endometrial polyps and symptomatic uterine fibroids. Patient has a long history of fibroids and underwent a myomectomy in 1999. In recent years the patient began bleeding 15 days out of the month requiring the change of an overnight pad, along with tissue paper, every 3 hours. Patient admits to occasionally soiling clothing & linen. She goes on to report cramping rated as 9/10 (on a 10 point pain scale) that is decreased to 4/10 with Tylenol. Occasionally, nine days after her 15 day bleed, she will experience 3 days of spotting. She admits to episodes of lightheadedness, but denies vaginitis symptoms, uti symptoms, or changes in BM. Patient is not currently sexually active.   U/S with SHG results from 11/10/2011 showed: uterus measuring 11.16 x 8.23 x 7.42 cm; Five fibroids: posterior subserosal-6.5 x 5.6 x 7.1 cm, posterior subserosal-3.7 x 3.4 x 4.4cm, anterior intramural-1.8 x 2.1 x 2.1cm, posterior intramural-2.2 x 2.0 x 2.6cm, and fundal-2.5 x 2.8 x 2.4cm.  SHG-complex endometrium, endometrial polyp-1.3 x 0.67cm, there is a suspected mass in the inferior portion of the endometrium ? fibroid-5.2 x 1.3cm, per 3D rendering.  Right ovary: 3.32 x 1.49 x 1.62cm and Left ovary: 2.54 x 1.43 x 1.58cm.   Endometrial biopsy (11/2011) did not demonstrate any hyperplasia, atypia or malignancy but mixed endometrial/endocervical polyp. Though patient was offered medical management for her symptoms, she has decided to proceed with a repeat myomectomy and endometrial ablation.   Past Medical History  OB History: G3 P 2-0-1-3 SVD 1985 and 1987   GYN History: menarche 51 years old LMP 03/23/12 Contracepton-abstinence, The patient denies history of sexually transmitted disease., has a history of  laser surgery on cervix in 1984 with normalt PAP smears since Last PAP smear 01/2012.   Medical History: anemia, insomnia, rectal bleeding with benign colon polyps    Surgical History: 1972 Tonsillectomy; 1987-Bilateral Tubal Ligation; 1999-Myomectomy; 2009-Hysteroscopy with D & C for polyps. Denies problems with anesthesia or history of blood transfusions   Family History: Hypertension, Diabetes, End-Stage Renal Disease, Breast Cancer (maternal grand-mother age 78), Cardio-Vascular Disease and Seizures   Social History: Patient is single and works as a counselor at [employer]   Habits: former cigarette smoker (quit 20 years ago), occasionally uses alcohol but denies illicit drug use.   Medications:  Multivitamins 1/day  Calcium 950 mg 1day  Ferrous Sulfate 325 mg twice daily   Allergies: ASA & Shellfish cause hives. Denies sensitivity to peanuts, soy,or latex   ROS:Patient reports no health concerns. wears glasses, has partial dental plates in upper and lower jaws, denies chest pain, shortness of breath, headache, vision changes, difficulty swallowing, nausea, vomiting, diarrhea, arthralgias or myalgias.   Physical Exam   BP 122/74  Pulse 74  Temp 97.8 F (36.6 C)  Resp 16  Ht 5' 4.5" (1.638 m)  Wt 184 lb (83.462 kg)  BMI 31.10 kg/m2  LMP 03/23/2012  BMI Body mass index is 31.10 kg/(m^2).  Neck: Neck supple. No adenopathy. Thyroid symmetric, normal size  Heart: Regular rate and rhythm.  Lungs: clear  Abdomen: abdomen is soft without tenderness or organomegaly; firm palpable mass from the pelvis to approximately 2 fingers above symphysis  Extremities: no clubbing cyanosis or edema  Pelvic:uterus 12-14 weeks size, irregular, tender, cervix displaced to right, vagina with scant blood &   atrophic and adnexae without tenderness or masses   Assesment:  1. Menorrhagia  2. Uterine Fibroids  3. Endometrial Polyps   Disposition: A discussion was held with patient  regarding the indication for her procedure(s) along with the risks, which include, but are not limited to: reaction to anesthesia, damage to adjacent organs, infection and excessive bleeding and possible need for an open abdominal incision. Patient also understands that the robotic approach to her surgery requires more time than that of an open abdominal incision, that she will experience transient post-operative facial edema, that her hospital stay is expected to be 0-2 days with return to regular activities in 2-3 weeks (except for intercourse which will be 6 weeks). The patient was given the Miralax bowel prep to be completed 24 hours prior to her surgery. The patient verbalized understanding of these risks and pre-operative instructions and has consented to proceed with a robot assisted myomectomy, hysteroscopy with D & C followed by Novasure endometrial ablation at [facility] [place], Apr 08, 2012 at 7:30 a.m.   CSN# [id number]   Jaecob Lowden J. Dj Senteno, PA-C for Dr. Rivard   

## 2012-03-29 NOTE — H&P (Signed)
[name] is a 51 y.o. single African-American female G3 P2-0-1-3 who presents for a robot assisted myomectomy with hysteroscopic resection of endometrial polyps and Novasure endometrial ablation because of menorrhagia, endometrial polyps and symptomatic uterine fibroids. Patient has a long history of fibroids and underwent a myomectomy in 1999. In recent years the patient began bleeding 15 days out of the month requiring the change of an overnight pad, along with tissue paper, every 3 hours. Patient admits to occasionally soiling clothing & linen. She goes on to report cramping rated as 9/10 (on a 10 point pain scale) that is decreased to 4/10 with Tylenol. Occasionally, nine days after her 15 day bleed, she will experience 3 days of spotting. She admits to episodes of lightheadedness, but denies vaginitis symptoms, uti symptoms, or changes in BM. Patient is not currently sexually active.   U/S with SHG results from 11/10/2011 showed: uterus measuring 11.16 x 8.23 x 7.42 cm; Five fibroids: posterior subserosal-6.5 x 5.6 x 7.1 cm, posterior subserosal-3.7 x 3.4 x 4.4cm, anterior intramural-1.8 x 2.1 x 2.1cm, posterior intramural-2.2 x 2.0 x 2.6cm, and fundal-2.5 x 2.8 x 2.4cm.  SHG-complex endometrium, endometrial polyp-1.3 x 0.67cm, there is a suspected mass in the inferior portion of the endometrium ? fibroid-5.2 x 1.3cm, per 3D rendering.  Right ovary: 3.32 x 1.49 x 1.62cm and Left ovary: 2.54 x 1.43 x 1.58cm.   Endometrial biopsy (11/2011) did not demonstrate any hyperplasia, atypia or malignancy but mixed endometrial/endocervical polyp. Though patient was offered medical management for her symptoms, she has decided to proceed with a repeat myomectomy and endometrial ablation.   Past Medical History  OB History: G3 P 2-0-1-3 SVD 1985 and 1987   GYN History: menarche 51 years old LMP 03/23/12 Contracepton-abstinence, The patient denies history of sexually transmitted disease., has a history of  laser surgery on cervix in 1984 with normalt PAP smears since Last PAP smear 01/2012.   Medical History: anemia, insomnia, rectal bleeding with benign colon polyps    Surgical History: 1972 Tonsillectomy; 1987-Bilateral Tubal Ligation; 1999-Myomectomy; 2009-Hysteroscopy with D & C for polyps. 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   Medications:  Multivitamins 1/day  Calcium 950 mg 1day  Ferrous Sulfate 325 mg twice daily   Allergies: ASA & Shellfish cause hives. Denies sensitivity to peanuts, soy,or latex   [id number] reports no health concerns. wears glasses, has partial dental plates in upper and lower jaws, denies chest pain, shortness of breath, headache, vision changes, difficulty swallowing, nausea, vomiting, diarrhea, arthralgias or myalgias.   Physical Exam   BP 122/74  Pulse 74  Temp 97.8 F (36.6 C)  Resp 16  Ht 5' 4.5" (1.638 m)  Wt 184 lb (83.462 kg)  BMI 31.10 kg/m2  LMP 03/23/2012  BMI Body mass index is 31.10 kg/(m^2).  Neck: Neck supple. No adenopathy. Thyroid symmetric, normal size  Heart: Regular rate and rhythm.  Lungs: clear  Abdomen: abdomen is soft without tenderness or organomegaly; firm palpable mass from the pelvis to approximately 2 fingers above symphysis  Extremities: no clubbing cyanosis or edema  Pelvic:uterus 12-14 weeks size, irregular, tender, cervix displaced to right, vagina with scant blood &  atrophic and adnexae without tenderness or masses   Assesment:  1. Menorrhagia  2. Uterine Fibroids  3. Endometrial Polyps   Disposition: A discussion was held with patient  regarding the indication for her procedure(s) along with the risks, which include, but are not limited to: reaction to anesthesia, damage to adjacent organs, infection and excessive bleeding and possible need for an open abdominal incision. Patient also understands that the robotic approach to her surgery requires more time than that of an open abdominal incision, that she will experience transient post-operative facial edema, that her hospital stay is expected to be 0-2 days with return to regular activities in 2-3 weeks (except for intercourse which will be 6 weeks). The patient was given the Miralax bowel prep to be completed 24 hours prior to her surgery. The patient verbalized understanding of these risks and pre-operative instructions and has consented to proceed with a robot assisted myomectomy, hysteroscopy with D & C followed by Novasure endometrial ablation at [facility] at [place], Apr 08, 2012 at 7:30 a.m.   CSN# [id number]   Kyvon Hu J. Lowell Guitar, PA-C for Dr. Estanislado Pandy

## 2012-04-01 ENCOUNTER — Telehealth: Payer: Self-pay | Admitting: Obstetrics and Gynecology

## 2012-04-01 ENCOUNTER — Other Ambulatory Visit: Payer: Self-pay

## 2012-04-01 MED ORDER — HYDROCODONE-ACETAMINOPHEN 5-500 MG PO TABS: 1.0000 | ORAL_TABLET | ORAL | Status: DC | PRN

## 2012-04-01 NOTE — Telephone Encounter (Signed)
Spoke with pt rgd concerns pt states having severe pain and pressure in vaginal and rectum area hurts to sit and walk no fever, no bleeding some chills no relief with tylenol pt wants rx for pain or eval with SR today advised pt will consult with provider and call her back pt voice understanding.

## 2012-04-01 NOTE — Telephone Encounter (Signed)
Spoke with pt rgd previous msg informed consult with SR and called in rx for pain for Vicodin pt voice understanding

## 2012-04-01 NOTE — Telephone Encounter (Signed)
Routed to niccole-addressed issue

## 2012-04-02 ENCOUNTER — Other Ambulatory Visit: Payer: Self-pay

## 2012-04-02 ENCOUNTER — Telehealth: Payer: Self-pay | Admitting: Obstetrics and Gynecology

## 2012-04-02 NOTE — Telephone Encounter (Signed)
Routed to laura/cht not needed/follow up on script

## 2012-04-07 MED ORDER — DEXTROSE 5 % IV SOLN
2.0000 g | INTRAVENOUS | Status: AC
  Administered 2012-04-08: 2 g via INTRAVENOUS
  Filled 2012-04-07: qty 2

## 2012-04-08 ENCOUNTER — Ambulatory Visit (HOSPITAL_COMMUNITY): Payer: BC Managed Care – PPO | Admitting: Anesthesiology

## 2012-04-08 ENCOUNTER — Encounter (HOSPITAL_COMMUNITY): Payer: Self-pay | Admitting: *Deleted

## 2012-04-08 ENCOUNTER — Ambulatory Visit (HOSPITAL_COMMUNITY)
Admission: RE | Admit: 2012-04-08 | Discharge: 2012-04-09 | Disposition: A | Discharge status: Home / Self Care | Payer: BC Managed Care – PPO | Source: Ambulatory Visit | Attending: Obstetrics and Gynecology | Admitting: Obstetrics and Gynecology

## 2012-04-08 ENCOUNTER — Encounter (HOSPITAL_COMMUNITY): Payer: Self-pay | Admitting: Anesthesiology

## 2012-04-08 ENCOUNTER — Other Ambulatory Visit: Payer: Self-pay | Admitting: Obstetrics and Gynecology

## 2012-04-08 ENCOUNTER — Encounter (HOSPITAL_COMMUNITY)
Admission: RE | Disposition: A | Discharge status: Home / Self Care | Payer: Self-pay | Source: Ambulatory Visit | Attending: Obstetrics and Gynecology

## 2012-04-08 DIAGNOSIS — N736 Female pelvic peritoneal adhesions (postinfective): Secondary | ICD-10-CM

## 2012-04-08 DIAGNOSIS — D250 Submucous leiomyoma of uterus: Secondary | ICD-10-CM

## 2012-04-08 DIAGNOSIS — D259 Leiomyoma of uterus, unspecified: Secondary | ICD-10-CM

## 2012-04-08 DIAGNOSIS — N920 Excessive and frequent menstruation with regular cycle: Secondary | ICD-10-CM | POA: Insufficient documentation

## 2012-04-08 DIAGNOSIS — D252 Subserosal leiomyoma of uterus: Secondary | ICD-10-CM | POA: Insufficient documentation

## 2012-04-08 DIAGNOSIS — D251 Intramural leiomyoma of uterus: Secondary | ICD-10-CM | POA: Insufficient documentation

## 2012-04-08 DIAGNOSIS — N840 Polyp of corpus uteri: Secondary | ICD-10-CM | POA: Insufficient documentation

## 2012-04-08 HISTORY — PX: ROBOT ASSISTED MYOMECTOMY: SHX5142

## 2012-04-08 LAB — TYPE AND SCREEN
ABO/RH(D): O POS
Antibody Screen: NEGATIVE

## 2012-04-08 SURGERY — ROBOTIC ASSISTED MYOMECTOMY
Anesthesia: General | Site: Uterus | Wound class: Clean Contaminated

## 2012-04-08 MED ORDER — ROCURONIUM BROMIDE 100 MG/10ML IV SOLN
INTRAVENOUS | Status: DC | PRN
  Administered 2012-04-08 (×3): 10 mg via INTRAVENOUS
  Administered 2012-04-08: 20 mg via INTRAVENOUS
  Administered 2012-04-08 (×2): 10 mg via INTRAVENOUS
  Administered 2012-04-08: 20 mg via INTRAVENOUS
  Administered 2012-04-08: 10 mg via INTRAVENOUS

## 2012-04-08 MED ORDER — FENTANYL CITRATE 0.05 MG/ML IJ SOLN
25.0000 ug | INTRAMUSCULAR | Status: DC | PRN
  Administered 2012-04-08 (×3): 50 ug via INTRAVENOUS

## 2012-04-08 MED ORDER — CHLOROPROCAINE HCL 1 % IJ SOLN
INTRAMUSCULAR | Status: AC
  Filled 2012-04-08: qty 30

## 2012-04-08 MED ORDER — PROMETHAZINE HCL 25 MG/ML IJ SOLN: 6.2500 mg | INTRAMUSCULAR | Status: DC | PRN

## 2012-04-08 MED ORDER — ROCURONIUM BROMIDE 50 MG/5ML IV SOLN
INTRAVENOUS | Status: AC
  Filled 2012-04-08: qty 1

## 2012-04-08 MED ORDER — HYDROMORPHONE HCL PF 1 MG/ML IJ SOLN
0.5000 mg | INTRAMUSCULAR | Status: DC | PRN
  Administered 2012-04-08: 0.5 mg via INTRAVENOUS

## 2012-04-08 MED ORDER — FENTANYL CITRATE 0.05 MG/ML IJ SOLN
INTRAMUSCULAR | Status: AC
  Filled 2012-04-08: qty 2

## 2012-04-08 MED ORDER — MIDAZOLAM HCL 2 MG/2ML IJ SOLN
INTRAMUSCULAR | Status: AC
  Filled 2012-04-08: qty 2

## 2012-04-08 MED ORDER — HYDROMORPHONE HCL PF 1 MG/ML IJ SOLN
INTRAMUSCULAR | Status: AC
  Administered 2012-04-08: 0.5 mg via INTRAVENOUS
  Filled 2012-04-08: qty 1

## 2012-04-08 MED ORDER — MEPERIDINE HCL 25 MG/ML IJ SOLN: 6.2500 mg | INTRAMUSCULAR | Status: DC | PRN

## 2012-04-08 MED ORDER — ONDANSETRON HCL 4 MG/2ML IJ SOLN
INTRAMUSCULAR | Status: DC | PRN
  Administered 2012-04-08: 4 mg via INTRAVENOUS

## 2012-04-08 MED ORDER — VASOPRESSIN 20 UNIT/ML IJ SOLN: INTRAVENOUS | Status: DC | PRN

## 2012-04-08 MED ORDER — HETASTARCH-ELECTROLYTES 6 % IV SOLN
INTRAVENOUS | Status: AC
  Filled 2012-04-08: qty 500

## 2012-04-08 MED ORDER — ACETAMINOPHEN 325 MG PO TABS: 325.0000 mg | ORAL_TABLET | ORAL | Status: DC | PRN

## 2012-04-08 MED ORDER — MIDAZOLAM HCL 2 MG/2ML IJ SOLN: 0.5000 mg | Freq: Once | INTRAMUSCULAR | Status: DC | PRN

## 2012-04-08 MED ORDER — OXYCODONE-ACETAMINOPHEN 5-325 MG PO TABS
1.0000 | ORAL_TABLET | ORAL | Status: DC | PRN
Start: 2012-04-08 — End: 2012-04-09

## 2012-04-08 MED ORDER — GLYCOPYRROLATE 0.2 MG/ML IJ SOLN
INTRAMUSCULAR | Status: AC
  Filled 2012-04-08: qty 1

## 2012-04-08 MED ORDER — SODIUM CHLORIDE 0.9 % IR SOLN: Status: DC | PRN

## 2012-04-08 MED ORDER — LIDOCAINE HCL (CARDIAC) 20 MG/ML IV SOLN
INTRAVENOUS | Status: AC
  Filled 2012-04-08: qty 5

## 2012-04-08 MED ORDER — NEOSTIGMINE METHYLSULFATE 1 MG/ML IJ SOLN
INTRAMUSCULAR | Status: DC | PRN
  Administered 2012-04-08: 1 mg via INTRAVENOUS
  Administered 2012-04-08: 2 mg via INTRAVENOUS

## 2012-04-08 MED ORDER — LACTATED RINGERS IV SOLN
INTRAVENOUS | Status: DC
  Administered 2012-04-09: 01:00:00 via INTRAVENOUS

## 2012-04-08 MED ORDER — BUPIVACAINE HCL (PF) 0.25 % IJ SOLN
INTRAMUSCULAR | Status: DC | PRN
  Administered 2012-04-08: 30 mL

## 2012-04-08 MED ORDER — HETASTARCH-ELECTROLYTES 6 % IV SOLN
INTRAVENOUS | Status: DC | PRN
  Administered 2012-04-08: 11:00:00 via INTRAVENOUS

## 2012-04-08 MED ORDER — PROPOFOL 10 MG/ML IV EMUL
INTRAVENOUS | Status: DC | PRN
  Administered 2012-04-08: 180 mg via INTRAVENOUS

## 2012-04-08 MED ORDER — NEOSTIGMINE METHYLSULFATE 1 MG/ML IJ SOLN
INTRAMUSCULAR | Status: AC
  Filled 2012-04-08: qty 10

## 2012-04-08 MED ORDER — ARTIFICIAL TEARS OP OINT
TOPICAL_OINTMENT | OPHTHALMIC | Status: AC
  Filled 2012-04-08: qty 3.5

## 2012-04-08 MED ORDER — FENTANYL CITRATE 0.05 MG/ML IJ SOLN
INTRAMUSCULAR | Status: AC
  Administered 2012-04-08: 50 ug via INTRAVENOUS
  Filled 2012-04-08: qty 2

## 2012-04-08 MED ORDER — SODIUM CHLORIDE 0.9 % IJ SOLN
INTRAMUSCULAR | Status: DC | PRN
  Administered 2012-04-08: 100 mL via INTRAVENOUS

## 2012-04-08 MED ORDER — VASOPRESSIN 20 UNIT/ML IJ SOLN
INTRAMUSCULAR | Status: AC
  Filled 2012-04-08: qty 3

## 2012-04-08 MED ORDER — OXYCODONE-ACETAMINOPHEN 5-325 MG PO TABS
1.0000 | ORAL_TABLET | ORAL | Status: DC | PRN
  Administered 2012-04-08 – 2012-04-09 (×2): 2 via ORAL
  Filled 2012-04-08 (×2): qty 2

## 2012-04-08 MED ORDER — MENTHOL 3 MG MT LOZG: 1.0000 | LOZENGE | OROMUCOSAL | Status: DC | PRN

## 2012-04-08 MED ORDER — PROPOFOL 10 MG/ML IV EMUL
INTRAVENOUS | Status: AC
  Filled 2012-04-08: qty 20

## 2012-04-08 MED ORDER — FENTANYL CITRATE 0.05 MG/ML IJ SOLN
INTRAMUSCULAR | Status: DC | PRN
  Administered 2012-04-08: 25 ug via INTRAVENOUS
  Administered 2012-04-08 (×2): 50 ug via INTRAVENOUS
  Administered 2012-04-08: 100 ug via INTRAVENOUS
  Administered 2012-04-08: 25 ug via INTRAVENOUS

## 2012-04-08 MED ORDER — DEXAMETHASONE SODIUM PHOSPHATE 10 MG/ML IJ SOLN
INTRAMUSCULAR | Status: AC
  Filled 2012-04-08: qty 1

## 2012-04-08 MED ORDER — VASOPRESSIN 20 UNIT/ML IJ SOLN
INTRAMUSCULAR | Status: DC | PRN
  Administered 2012-04-08: 40 [IU] via INTRAVENOUS

## 2012-04-08 MED ORDER — PROMETHAZINE HCL 25 MG PO TABS: 12.5000 mg | ORAL_TABLET | Freq: Four times a day (QID) | ORAL | Status: DC | PRN

## 2012-04-08 MED ORDER — ACETAMINOPHEN 10 MG/ML IV SOLN
1000.0000 mg | Freq: Once | INTRAVENOUS | Status: AC
  Administered 2012-04-08: 1000 mg via INTRAVENOUS
  Filled 2012-04-08: qty 100

## 2012-04-08 MED ORDER — OXYCODONE-ACETAMINOPHEN 5-325 MG PO TABS: 1.0000 | ORAL_TABLET | ORAL | Status: DC | PRN

## 2012-04-08 MED ORDER — LACTATED RINGERS IR SOLN
Status: DC | PRN
  Administered 2012-04-08: 6000 mL

## 2012-04-08 MED ORDER — ONDANSETRON HCL 4 MG/2ML IJ SOLN
INTRAMUSCULAR | Status: AC
  Filled 2012-04-08: qty 2

## 2012-04-08 MED ORDER — FENTANYL CITRATE 0.05 MG/ML IJ SOLN
INTRAMUSCULAR | Status: AC
  Filled 2012-04-08: qty 5

## 2012-04-08 MED ORDER — LACTATED RINGERS IV SOLN
INTRAVENOUS | Status: DC
  Administered 2012-04-08 (×4): via INTRAVENOUS

## 2012-04-08 MED ORDER — LIDOCAINE HCL (CARDIAC) 20 MG/ML IV SOLN
INTRAVENOUS | Status: DC | PRN
  Administered 2012-04-08: 50 mg via INTRAVENOUS

## 2012-04-08 MED ORDER — LUBRIFRESH P.M. OP OINT
TOPICAL_OINTMENT | OPHTHALMIC | Status: DC | PRN
  Administered 2012-04-08: 1 via OPHTHALMIC

## 2012-04-08 MED ORDER — DEXTROSE 5 % IV SOLN
INTRAVENOUS | Status: DC | PRN
  Administered 2012-04-08: 250 mL via INTRAVENOUS

## 2012-04-08 MED ORDER — GLYCOPYRROLATE 0.2 MG/ML IJ SOLN
INTRAMUSCULAR | Status: DC | PRN
  Administered 2012-04-08: 0.1 mg via INTRAVENOUS
  Administered 2012-04-08: 0.4 mg via INTRAVENOUS
  Administered 2012-04-08: 0.2 mg via INTRAVENOUS

## 2012-04-08 MED ORDER — MIDAZOLAM HCL 5 MG/5ML IJ SOLN
INTRAMUSCULAR | Status: DC | PRN
  Administered 2012-04-08: 2 mg via INTRAVENOUS

## 2012-04-08 MED ORDER — GLYCINE 1.5 % IR SOLN
Status: DC | PRN
  Administered 2012-04-08: 3000 mL

## 2012-04-08 MED ORDER — DEXAMETHASONE SODIUM PHOSPHATE 10 MG/ML IJ SOLN
INTRAMUSCULAR | Status: DC | PRN
  Administered 2012-04-08: 10 mg via INTRAVENOUS

## 2012-04-08 SURGICAL SUPPLY — 93 items
ABLATOR ENDOMETRIAL BIPOLAR (ABLATOR) ×4 IMPLANT
ADH SKN CLS APL DERMABOND .7 (GAUZE/BANDAGES/DRESSINGS) ×3
APL SKNCLS STERI-STRIP NONHPOA (GAUZE/BANDAGES/DRESSINGS) ×3
APPLICATOR COTTON TIP 6IN STRL (MISCELLANEOUS) ×4 IMPLANT
BARRIER ADHS 3X4 INTERCEED (GAUZE/BANDAGES/DRESSINGS) ×6 IMPLANT
BENZOIN TINCTURE PRP APPL 2/3 (GAUZE/BANDAGES/DRESSINGS) ×4 IMPLANT
BLADE LAP MORCELLATOR 15X9.5 (ELECTROSURGICAL) IMPLANT
BLADE LAPAROSCOPIC MORCELL KIT (BLADE) ×4 IMPLANT
BRR ADH 4X3 ABS CNTRL BYND (GAUZE/BANDAGES/DRESSINGS) ×6
CANNULA SEAL DVNC (CANNULA) ×9 IMPLANT
CANNULA SEALS DA VINCI (CANNULA) ×3
CATH FOLEY 3WAY  5CC 18FR (CATHETERS) ×1
CATH FOLEY 3WAY 5CC 18FR (CATHETERS) ×3 IMPLANT
CATH ROBINSON RED A/P 16FR (CATHETERS) ×4 IMPLANT
CATH THERMACHOICE III (CATHETERS) ×2 IMPLANT
CHLORAPREP W/TINT 26ML (MISCELLANEOUS) ×4 IMPLANT
CLOTH BEACON ORANGE TIMEOUT ST (SAFETY) ×4 IMPLANT
CONT PATH 16OZ SNAP LID 3702 (MISCELLANEOUS) ×4 IMPLANT
CONTAINER PREFILL 10% NBF 60ML (FORM) IMPLANT
COVER MAYO STAND STRL (DRAPES) ×4 IMPLANT
COVER TABLE BACK 60X90 (DRAPES) ×8 IMPLANT
COVER TIP SHEARS 8 DVNC (MISCELLANEOUS) ×3 IMPLANT
COVER TIP SHEARS 8MM DA VINCI (MISCELLANEOUS) ×1
DECANTER SPIKE VIAL GLASS SM (MISCELLANEOUS) ×8 IMPLANT
DERMABOND ADVANCED (GAUZE/BANDAGES/DRESSINGS) ×1
DERMABOND ADVANCED .7 DNX12 (GAUZE/BANDAGES/DRESSINGS) ×3 IMPLANT
DRAPE HUG U DISPOSABLE (DRAPE) ×4 IMPLANT
DRAPE LG THREE QUARTER DISP (DRAPES) ×8 IMPLANT
DRAPE MONITOR DA VINCI (DRAPE) IMPLANT
DRAPE PROXIMA HALF (DRAPES) IMPLANT
DRAPE WARM FLUID 44X44 (DRAPE) ×4 IMPLANT
ELECT REM PT RETURN 9FT ADLT (ELECTROSURGICAL) ×4
ELECTRODE REM PT RTRN 9FT ADLT (ELECTROSURGICAL) ×3 IMPLANT
ELECTRODE RT ANGLE VERSAPOINT (CUTTING LOOP) ×2 IMPLANT
EVACUATOR SMOKE 8.L (FILTER) ×4 IMPLANT
GAUZE VASELINE 3X9 (GAUZE/BANDAGES/DRESSINGS) IMPLANT
GLOVE BIOGEL PI IND STRL 7.0 (GLOVE) ×9 IMPLANT
GLOVE BIOGEL PI IND STRL 7.5 (GLOVE) ×4 IMPLANT
GLOVE BIOGEL PI INDICATOR 7.0 (GLOVE) ×3
GLOVE BIOGEL PI INDICATOR 7.5 (GLOVE) ×4
GLOVE ECLIPSE 6.5 STRL STRAW (GLOVE) ×12 IMPLANT
GLOVE SURG SS PI 6.5 STRL IVOR (GLOVE) ×6 IMPLANT
GLOVE SURG SS PI 7.5 STRL IVOR (GLOVE) ×18 IMPLANT
GOWN PREVENTION PLUS LG XLONG (DISPOSABLE) ×8 IMPLANT
GOWN STRL REIN XL XLG (GOWN DISPOSABLE) ×28 IMPLANT
IV SET ADMIN PUMP GEMINI W/NLD (IV SETS) IMPLANT
KIT ABG SYR 3ML LUER SLIP (SYRINGE) ×4 IMPLANT
KIT ACCESSORY DA VINCI DISP (KITS) ×1
KIT ACCESSORY DVNC DISP (KITS) ×3 IMPLANT
KIT DISP ACCESSORY 4 ARM (KITS) ×2 IMPLANT
MANIPULATOR UTERINE 4.5 ZUMI (MISCELLANEOUS) IMPLANT
NDL INSUFFLATION 14GA 120MM (NEEDLE) ×2 IMPLANT
NEEDLE HYPO 22GX1.5 SAFETY (NEEDLE) ×4 IMPLANT
NEEDLE INSUFFLATION 14GA 120MM (NEEDLE) IMPLANT
NS IRRIG 1000ML POUR BTL (IV SOLUTION) ×12 IMPLANT
PACK HYSTEROSCOPY LF (CUSTOM PROCEDURE TRAY) ×4 IMPLANT
PACK LAVH (CUSTOM PROCEDURE TRAY) ×4 IMPLANT
PAD PREP 24X48 CUFFED NSTRL (MISCELLANEOUS) ×4 IMPLANT
SET CYSTO W/LG BORE CLAMP LF (SET/KITS/TRAYS/PACK) ×2 IMPLANT
SET IRRIG TUBING LAPAROSCOPIC (IRRIGATION / IRRIGATOR) ×4 IMPLANT
SOLUTION ELECTROLUBE (MISCELLANEOUS) ×4 IMPLANT
SPONGE LAP 18X18 X RAY DECT (DISPOSABLE) IMPLANT
STRIP CLOSURE SKIN 1/2X4 (GAUZE/BANDAGES/DRESSINGS) ×4 IMPLANT
SUT DVC VLOC 180 0 12IN GS21 (SUTURE) ×4
SUT MNCRL AB 3-0 PS2 27 (SUTURE) ×8 IMPLANT
SUT VIC AB 0 CT1 27 (SUTURE) ×24
SUT VIC AB 0 CT1 27XBRD ANTBC (SUTURE) ×18 IMPLANT
SUT VIC AB 0 CT2 27 (SUTURE) IMPLANT
SUT VIC AB 2-0 CT2 27 (SUTURE) ×4 IMPLANT
SUT VIC AB 3-0 SH 27 (SUTURE) ×4
SUT VIC AB 3-0 SH 27XBRD (SUTURE) ×1 IMPLANT
SUT VICRYL 0 UR6 27IN ABS (SUTURE) ×8 IMPLANT
SUT VLOC 180 0 9IN  GS21 (SUTURE) ×3
SUT VLOC 180 0 9IN GS21 (SUTURE) ×5 IMPLANT
SUT VLOC 180 2-0 6IN GS21 (SUTURE) ×4 IMPLANT
SUT VLOC 180 2-0 9IN GS21 (SUTURE) ×2 IMPLANT
SUTURE DVC VLC 180 0 12IN GS21 (SUTURE) ×1 IMPLANT
SYR 50ML LL SCALE MARK (SYRINGE) ×4 IMPLANT
SYRINGE 10CC LL (SYRINGE) ×2 IMPLANT
SYSTEM CONVERTIBLE TROCAR (TROCAR) IMPLANT
TIP UTERINE 5.1X6CM LAV DISP (MISCELLANEOUS) IMPLANT
TIP UTERINE 6.7X10CM GRN DISP (MISCELLANEOUS) ×2 IMPLANT
TIP UTERINE 6.7X6CM WHT DISP (MISCELLANEOUS) IMPLANT
TIP UTERINE 6.7X8CM BLUE DISP (MISCELLANEOUS) IMPLANT
TOWEL OR 17X24 6PK STRL BLUE (TOWEL DISPOSABLE) ×12 IMPLANT
TRAY FOLEY CATH 16FR SILVER (SET/KITS/TRAYS/PACK) ×4 IMPLANT
TROCAR 12M 150ML BLUNT (TROCAR) ×2 IMPLANT
TROCAR DISP BLADELESS 8 DVNC (TROCAR) ×3 IMPLANT
TROCAR DISP BLADELESS 8MM (TROCAR) ×1
TROCAR XCEL 12X100 BLDLESS (ENDOMECHANICALS) ×4 IMPLANT
TROCAR Z-THREAD BLADED 12X100M (TROCAR) ×2 IMPLANT
TUBING FILTER THERMOFLATOR (ELECTROSURGICAL) ×4 IMPLANT
WATER STERILE IRR 1000ML POUR (IV SOLUTION) ×4 IMPLANT

## 2012-04-08 NOTE — H&P (View-Only) (Signed)
[name] is a 51 y.o. single African-American female G3 P2-0-1-3 who presents for a robot assisted myomectomy with hysteroscopic resection of endometrial polyps and Novasure endometrial ablation because of menorrhagia, endometrial polyps and symptomatic uterine fibroids. Patient has a long history of fibroids and underwent a myomectomy in 1999. In recent years the patient began bleeding 15 days out of the month requiring the change of an overnight pad, along with tissue paper, every 3 hours. Patient admits to occasionally soiling clothing & linen. She goes on to report cramping rated as 9/10 (on a 10 point pain scale) that is decreased to 4/10 with Tylenol. Occasionally, nine days after her 15 day bleed, she will experience 3 days of spotting. She admits to episodes of lightheadedness, but denies vaginitis symptoms, uti symptoms, or changes in BM. Patient is not currently sexually active.   U/S with SHG results from 11/10/2011 showed: uterus measuring 11.16 x 8.23 x 7.42 cm; Five fibroids: posterior subserosal-6.5 x 5.6 x 7.1 cm, posterior subserosal-3.7 x 3.4 x 4.4cm, anterior intramural-1.8 x 2.1 x 2.1cm, posterior intramural-2.2 x 2.0 x 2.6cm, and fundal-2.5 x 2.8 x 2.4cm.  SHG-complex endometrium, endometrial polyp-1.3 x 0.67cm, there is a suspected mass in the inferior portion of the endometrium ? fibroid-5.2 x 1.3cm, per 3D rendering.  Right ovary: 3.32 x 1.49 x 1.62cm and Left ovary: 2.54 x 1.43 x 1.58cm.   Endometrial biopsy (11/2011) did not demonstrate any hyperplasia, atypia or malignancy but mixed endometrial/endocervical polyp. Though patient was offered medical management for her symptoms, she has decided to proceed with a repeat myomectomy and endometrial ablation.   Past Medical History  OB History: G3 P 2-0-1-3 SVD 1985 and 1987   GYN History: menarche 51 years old LMP 03/23/12 Contracepton-abstinence, The patient denies history of sexually transmitted disease., has a history of  laser surgery on cervix in 1984 with normalt PAP smears since Last PAP smear 01/2012.   Medical History: anemia, insomnia, rectal bleeding with benign colon polyps    Surgical History: 1972 Tonsillectomy; 1987-Bilateral Tubal Ligation; 1999-Myomectomy; 2009-Hysteroscopy with D & C for polyps. 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   Medications:  Multivitamins 1/day  Calcium 950 mg 1day  Ferrous Sulfate 325 mg twice daily   Allergies: ASA & Shellfish cause hives. Denies sensitivity to peanuts, soy,or latex   ROS:Patient reports no health concerns. wears glasses, has partial dental plates in upper and lower jaws, denies chest pain, shortness of breath, headache, vision changes, difficulty swallowing, nausea, vomiting, diarrhea, arthralgias or myalgias.   Physical Exam   BP 122/74  Pulse 74  Temp 97.8 F (36.6 C)  Resp 16  Ht 5' 4.5" (1.638 m)  Wt 184 lb (83.462 kg)  BMI 31.10 kg/m2  LMP 03/23/2012  BMI Body mass index is 31.10 kg/(m^2).  Neck: Neck supple. No adenopathy. Thyroid symmetric, normal size  Heart: Regular rate and rhythm.  Lungs: clear  Abdomen: abdomen is soft without tenderness or organomegaly; firm palpable mass from the pelvis to approximately 2 fingers above symphysis  Extremities: no clubbing cyanosis or edema  Pelvic:uterus 12-14 weeks size, irregular, tender, cervix displaced to right, vagina with scant blood &   atrophic and adnexae without tenderness or masses   Assesment:  1. Menorrhagia  2. Uterine Fibroids  3. Endometrial Polyps   Disposition: A discussion was held with patient  regarding the indication for her procedure(s) along with the risks, which include, but are not limited to: reaction to anesthesia, damage to adjacent organs, infection and excessive bleeding and possible need for an open abdominal incision. Patient also understands that the robotic approach to her surgery requires more time than that of an open abdominal incision, that she will experience transient post-operative facial edema, that her hospital stay is expected to be 0-2 days with return to regular activities in 2-3 weeks (except for intercourse which will be 6 weeks). The patient was given the Miralax bowel prep to be completed 24 hours prior to her surgery. The patient verbalized understanding of these risks and pre-operative instructions and has consented to proceed with a robot assisted myomectomy, hysteroscopy with D & C followed by Novasure endometrial ablation at [facility] [place], Apr 08, 2012 at 7:30 a.m.   CSN# [id number]   Aviv Rota J. Kipp Shank, PA-C for Dr. Rivard   

## 2012-04-08 NOTE — Anesthesia Postprocedure Evaluation (Signed)
  Anesthesia Post-op Note  Patient: [name]  Procedure(s) Performed: Procedure(s) (LRB): ROBOTIC ASSISTED MYOMECTOMY (N/A) ROBOTIC ASSISTED LAPAROSCOPIC LYSIS OF ADHESION (N/A) DILATATION & CURETTAGE/HYSTEROSCOPY WITH THERMACHOICE ABLATION (N/A)  Patient Location: PACU  Anesthesia Type: General  Level of Consciousness: awake, alert  and oriented  Airway and Oxygen Therapy: Patient Spontanous Breathing  Post-op Pain: none  Post-op Assessment: Post-op Vital signs reviewed, Patient's Cardiovascular Status Stable, Respiratory Function Stable, Patent Airway, No signs of Nausea or vomiting and Pain level controlled  Post-op Vital Signs: Reviewed and stable  Complications: No apparent anesthesia complications

## 2012-04-08 NOTE — Brief Op Note (Signed)
04/08/2012  3:02 PM  PATIENT:  [name]  51 y.o. female  PRE-OPERATIVE DIAGNOSIS:  Fibriods; Dysfunctional Uterine Bleeding  POST-OPERATIVE DIAGNOSIS:  Fibriods; Dysfunctional Uterine Bleeding, pelvic adhesions  PROCEDURE:  Procedure(s) (LRB): ROBOTIC ASSISTED MYOMECTOMY (N/A) ROBOTIC ASSISTED LAPAROSCOPIC LYSIS OF ADHESION (N/A) DILATATION & CURETTAGE/HYSTEROSCOPY WITH THERMACHOICE ABLATION (N/A) and Versapoint myomectomy  SURGEON:  Surgeon(s) and Role:    * Esmeralda Arthur, MD - Primary  ASSISTANTS: Henreitta Leber PA-C   ANESTHESIA:   general  EBL:  Total I/O In: 2500 [I.V.:2000; IV Piggyback:500] Out: 1175 [Urine:900; Other:25; Blood:250]    LOCAL MEDICATIONS USED:  MARCAINE     SPECIMEN:  Uterine fibroids and endometrial curettings  DISPOSITION OF SPECIMEN:  PATHOLOGY  COUNTS:  YES  EBL: 250 cc

## 2012-04-08 NOTE — Brief Op Note (Deleted)
04/08/2012  2:37 PM  PATIENT:  [name]  51 y.o. female  PRE-OPERATIVE DIAGNOSIS:  Fibriods; Dysfunctional Uterine Bleeding  POST-OPERATIVE DIAGNOSIS:  Fibriods; Dysfunctional Uterine Bleeding  PROCEDURE:   ROBOTIC ASSISTED MYOMECTOMY, ROBOTIC ASSISTED LAPAROSCOPIC LYSIS OF ADHESION, DILATATION & CURETTAGE/HYSTEROSCOPY WITH VERSAPOINT RESECTION OF A SUBMUCOSAL FIBROID AND THERMA-CHOICE ENDOMETRIAL ABLATION  SURGEON: Esmeralda Arthur, MD     ASSISTANTS: Temia Debroux J. Lowell Guitar, PA-C  ANESTHESIA:   local and general    Total I/O In: 2500 [I.V.:2000; IV Piggyback:500] Out: 1175 [Urine:900; Other:25; Blood:250]  LOCAL MEDICATIONS USED:  MARCAINE   0.25% 20 cc  SPECIMEN:  Source of Specimen:  uterine fibroids x 5; endometrial currettings  DISPOSITION OF SPECIMEN:  PATHOLOGY  COUNTS:  YES  DICTATION: .Other Dictation: Dictation Number   PLAN OF CARE: Extended Care, discharge home later today  PATIENT DISPOSITION:  PACU - hemodynamically stable.    Shanee Batch J. Lowell Guitar, PA-C

## 2012-04-08 NOTE — Anesthesia Procedure Notes (Signed)
Procedure Name: Intubation Date/Time: 04/08/2012 7:49 AM Performed by: Cephus Shelling A Pre-anesthesia Checklist: Patient identified, Patient being monitored, Emergency Drugs available and Suction available Patient Re-evaluated:Patient Re-evaluated prior to inductionOxygen Delivery Method: Circle system utilized Preoxygenation: Pre-oxygenation with 100% oxygen Intubation Type: IV induction, Inhalational induction and Cricoid Pressure applied Ventilation: Mask ventilation without difficulty Laryngoscope Size: Mac and 3 Grade View: Grade III Tube type: Oral Tube size: 7.0 mm Number of attempts: 2 Airway Equipment and Method: Rigid stylet and Video-laryngoscopy Placement Confirmation: ETT inserted through vocal cords under direct vision,  positive ETCO2 and breath sounds checked- equal and bilateral Secured at: 20 (right lips) cm Tube secured with: Tape Dental Injury: Teeth and Oropharynx as per pre-operative assessment  Difficulty Due To: Difficulty was unanticipated and Difficult Airway- due to dentition Future Recommendations: Recommend- induction with short-acting agent, and alternative techniques readily available Comments: Small mouth opening. Mac3 X1, grade 3 view. Glide scope X1 Grade 1 view.

## 2012-04-08 NOTE — Op Note (Signed)
Preoperative diagnosis: symptomatic uterine fibroids with menometrorrhagia  Postoperative diagnosis: Severe pelvic adhesions, multiple uterine fibroids including 2 submucosal  Anesthesia: Gen.  Anesthesiologist: Dr. Cristela Blue  Procedure: Robotically-assisted lysis of adhesion, robotically assisted myomectomy, diagnostic hysteroscopy, D&C, VersaPoint myomectomy x2, and ThermaChoice endometrial ablation  Surgeon: Dr. Dois Davenport Flois M[place]ague  Assistant: Henreitta Leber PA-C  Estimated blood loss: 250 cc  Procedure:  After being informed of the planned procedure with possible complications including but not limited to bleeding, infection, injury to other organs, need for laparotomy, persistence of fibroids, informed consent is obtained and patient is taken to or #9. She is placed in the lithotomy position on the speaking mattress and beanbag with knee-high sequential compressive devices in both arms tucked and padded.  Pelvic exam reveals a enlarged uterus with a dominant posterior fibroid with a total volume of approximately 14 weeks. Adnexa are not felt.  A weighted speculum is inserted in the vagina and the anterior lip of the cervix was grasped with a tenaculum forcep. Uterus was sounded to 13 cm. The cervix was easily dilated using Hegar dilator until #29. This allows for easy placement of an RUMY #10 intrauterine manipulator, a 4.0 KOH ring. Speculum is removed and a Foley catheter is inserted in the bladder.  We measures 6 cm above the umbilicus and infiltrated the area with 5 cc of Marcaine 0.25. We performed a vertical 10 mm incision which is brought down bluntly to the fascia. The fascia is identified and grasped with Coker forceps and incised with Mayo scissors. Peritoneum is entered bluntly. A pursestring suture of 0 Vicryl is placed on the fascia and a 10 mm Hassan trocar is positioned in the abdominal cavity held in place by the pursestring suture. This allows for easy insufflation of the  pneumoperitoneum using warm to CO2 at a maximum pressure of 15 mm of mercury.and in  First evaluation reveals grade 4 adhesions between the small bowel covering the uterus completely we're we cannot see the posterior aspect of the uterus or adnexa. Fortunately we are able to position all of her trochars under direct position. We positioned 28 mm robotic trocar on the left one 8 mm Rothenberger on the right and one 10 mm patient side assistant trocar on the right after infiltrating each site with Marcaine 0.25. This allows for docking of the robot.  Preparation and docking took 45 minutes. A monopolar scissor is inserted in #1 PK gyrus forcep in arm and #2 and arm #3 is left unutilized for now. We proceed with systematic lysis of adhesions using blunt and sharp dissection. We are able to find planes for most of this dissection except for a 1 cm section of a bowel loop attached to the posterior aspect of the uterus. Despite multiple attempts we are unable to find a plane of dissection. So we proceed with resection of the uterine serosa around this adhesion and freed the loop of bowel. We cauterized the uterus for hemostasis. Lysis of adhesion took an hour and 15 minutes before we could actually see the uterus.  Now were able to see a dominant posterior fibroid measuring approximately 8 cm despite not having complete access to the posterior cul-de-sac. The cul-de-sac is obliterated with adhesions but it is felt unnecessary to pursue more dissection. We also identify for subserosal fibroid at the fundus with the largest one being 3.5 cm and the other ones varying between 1.5-2 cm. The right ovary is visualized and appears normal. We are unable to visualize the left  adnexa due to adhesions which again are felt unnecessary to manage at this time. Inserting a tenaculum and arm #3 we now proceed with myomectomy starting with a dominant posterior fibroid. We infiltrate the serosa using vasopressin 40 units in 100 cc  until we have complete blanching. Using an open monopolar scissor we perform a vertical incision on the posterior aspect of the uterus until we can identify the fibroid which is then grasped with the tenaculum forcep. We then proceed systematically and with some difficulty, dissecting sharply and bluntly with traction and countertraction until we are able to enucleate the posterior fibroid completely. This leaves [place] with a 6 cm vertical defect in the posterior wall of the uterus with no entry into the cavity. Exchanging instruments were suture cut and suture holder we proceed with closure in 3 planes of this myometrial defect using 0 V-Lock suture. Hemostasis is adequate. We then infiltrate the fundal area of the uterus to proceed with removal of all 4 fibroids in the same fashion with traction countertraction blunt and sharp dissection. This leaves [place] with a 3 cm superficial defect in the myometrium which is closed with 2 layers of running suture of 0 V-Lock. The serosa of this defect is closed with a baseball stitch of 2-0 V-Lock. Hemostasis is adequate. We irrigated profusely using warm saline to again confirm satisfactory hemostasis. All the fibroids that were removed were deposited either in the posterior or anterior cul-de-sac. Consult time at this time lasted one hour and 25 minutes for removal of 5 fibroids and closure of myometrial defect. The robot is undocked.  We removed a 10 mm patient side assistant trocar to insert under direct position the morcellator. Under direct position we then morcellate all 5 fibroids which takes 30 minutes. We irrigated again profusely to confirm a satisfactory hemostasis. We then cover both myometrial defect with each a sheet of Interceed. All instruments are then removed after evacuating the pneumoperitoneum. The supraumbilical incision fascia is closed with a pursestring suture of 0 Vicryl. The fascia of the 10 mm right side trocar is closed with a figure-of-eight stitch  of 0 Vicryl. The skin of all incision is then closed with a subcuticular suture of 3-0 Monocryl and Dermabond.  We then proceed with hysteroscopy. The intrauterine manipulator is removed as well as the: Ring. The cervix is we grasped with a Christella Hartigan forcep and the uterus was now sounded to 10 cm. The cervix was easily dilated using Hegar dilator until #29 which allows for easy placement of the operative hysteroscope. Initially we only documents a thickened diffused endometrium. That is why we removed the hysteroscope and proceeded with sharp curettage of the endometrial cavity. This was done under glycine perfusion at a maximum pressure of 90 mm of mercury. We now returned in the endometrial cavity and note to fibroids one being submucosal at the fundus and one being pedunculated at the right cornua measuring approximately 3 cm. Decision is made at this point to proceed with myomectomy and instruments are then modified for a VersaPoint hysteroscope. With the flushing the cavity with normal saline and now achieving adequate visualization with a maximum pressure of 90 mm of mercury we easily removed the 2 fibroid using the vaporizer. We now have a uterine cavity that is still slightly irregular and that is why decision is made to proceed with ThermaChoice endometrial ablation as opposed to previously planned and NovaSure endometrial ablation.  Changing are medium, we flushed the uterine cavity with the D5 W. We  then proceed with ThermaChoice endometrial ablation with some difficulty due to the fact that R. D5W was called to start with which required 4 attempts to warm the fluid at 87. When we finally succeeded in achieving the optimum temperature endometrial ablation went without problems. The hysteroscopy part of this procedure lasted one hour and 15 minutes including the VersaPoint myomectomy, D&C and the endometrial ablation. All instruments are then removed again and we note a bleeding site on the anterior lip  of the cervix which is controlled with a figure-of-eight stitch of 3-0 Vicryl.  Instrument and sponge count is complete x2 estimated blood loss is 250 cc. The procedure is well tolerated by the patient who returns to recovery room in a well and stable condition.  Specimen: Morcellated fibroid and endometrial curettings all sent to pathology  [name].D.

## 2012-04-08 NOTE — Anesthesia Preprocedure Evaluation (Signed)
Anesthesia Evaluation  Patient identified by MRN, date of birth, ID band Patient awake    Reviewed: Allergy & Precautions, H&P , Patient's Chart, lab work & pertinent test results, reviewed documented beta blocker date and time   History of Anesthesia Complications Negative for: history of anesthetic complications  Airway Mallampati: II TM Distance: >3 FB Neck ROM: full    Dental No notable dental hx.    Pulmonary neg pulmonary ROS,  breath sounds clear to auscultation  Pulmonary exam normal       Cardiovascular Exercise Tolerance: Good negative cardio ROS  Rhythm:regular Rate:Normal     Neuro/Psych negative neurological ROS  negative psych ROS   GI/Hepatic negative GI ROS, Neg liver ROS,   Endo/Other  negative endocrine ROS  Renal/GU negative Renal ROS     Musculoskeletal   Abdominal   Peds  Hematology negative hematology ROS (+)   Anesthesia Other Findings Anemia     Obesity        History of uterine fibroid     Shortness of breath   episode of "wheezing">1 mo ago    Reproductive/Obstetrics negative OB ROS                           Anesthesia Physical Anesthesia Plan  ASA: II  Anesthesia Plan: General ETT   Post-op Pain Management:    Induction:   Airway Management Planned:   Additional Equipment:   Intra-op Plan:   Post-operative Plan:   Informed Consent: I have reviewed the patients History and Physical, chart, labs and discussed the procedure including the risks, benefits and alternatives for the proposed anesthesia with the patient or authorized representative who has indicated his/her understanding and acceptance.   Dental Advisory Given  Plan Discussed with: CRNA and Surgeon  Anesthesia Plan Comments:         Anesthesia Quick Evaluation

## 2012-04-08 NOTE — Transfer of Care (Signed)
Immediate Anesthesia Transfer of Care Note  Patient: [name]  Procedure(s) Performed: Procedure(s) (LRB): ROBOTIC ASSISTED MYOMECTOMY (N/A) ROBOTIC ASSISTED LAPAROSCOPIC LYSIS OF ADHESION (N/A) DILATATION & CURETTAGE/HYSTEROSCOPY WITH THERMACHOICE ABLATION (N/A)  Patient Location: PACU  Anesthesia Type: General  Level of Consciousness: sedated and obtunded  Airway & Oxygen Therapy: Patient Spontanous Breathing and Patient connected to T-piece oxygen  Post-op Assessment: Report given to PACU RN  Post vital signs: Reviewed and unstable  Complications: No apparent anesthesia complications

## 2012-04-08 NOTE — Interval H&P Note (Signed)
History and Physical Interval Note:  04/08/2012 7:20 AM  [name]  has presented today for surgery, with the diagnosis of Fibriods; Dysfunctional Uterine Bleeding  The various methods of treatment have been discussed with the patient and family. After consideration of risks, benefits and other options for treatment, the patient has consented to  Procedure(s) (LRB): DILATATION & CURETTAGE/HYSTEROSCOPY WITH NOVASURE ABLATION (N/A) ROBOTIC ASSISTED MYOMECTOMY () as a surgical intervention .  The patients' history has been reviewed, patient examined, no change in status, stable for surgery.  I have reviewed the patients' chart and labs.  Questions were answered to the patient's satisfaction.     Azelyn Batie A

## 2012-04-09 ENCOUNTER — Encounter (HOSPITAL_COMMUNITY): Payer: Self-pay | Admitting: Obstetrics and Gynecology

## 2012-04-09 LAB — CBC
HCT: 32.1 % — ABNORMAL LOW (ref 36.0–46.0)
MCH: 25.9 pg — ABNORMAL LOW (ref 26.0–34.0)
MCV: 82.3 fL (ref 78.0–100.0)
Platelets: 290 10*3/uL (ref 150–400)
RBC: 3.9 MIL/uL (ref 3.87–5.11)
WBC: 12.2 10*3/uL — ABNORMAL HIGH (ref 4.0–10.5)

## 2012-04-09 MED ORDER — HYDROCODONE-ACETAMINOPHEN 5-500 MG PO TABS: 1.0000 | ORAL_TABLET | Freq: Four times a day (QID) | ORAL | Status: DC | PRN

## 2012-04-09 MED ORDER — PROMETHAZINE HCL 12.5 MG PO TABS: 12.5000 mg | ORAL_TABLET | Freq: Four times a day (QID) | ORAL | Status: DC | PRN

## 2012-04-09 MED ORDER — PHENAZOPYRIDINE HCL 100 MG PO TABS
100.0000 mg | ORAL_TABLET | Freq: Three times a day (TID) | ORAL | Status: DC
  Filled 2012-04-09 (×5): qty 1

## 2012-04-09 MED ORDER — PHENAZOPYRIDINE HCL 100 MG PO TABS: 100.0000 mg | ORAL_TABLET | Freq: Three times a day (TID) | ORAL | Status: AC | PRN

## 2012-04-09 NOTE — Anesthesia Postprocedure Evaluation (Signed)
  Anesthesia Post-op Note  Patient: [name]  Procedure(s) Performed: Procedure(s) (LRB): ROBOTIC ASSISTED MYOMECTOMY (N/A) ROBOTIC ASSISTED LAPAROSCOPIC LYSIS OF ADHESION (N/A) DILATATION & CURETTAGE/HYSTEROSCOPY WITH THERMACHOICE ABLATION (N/A)  Patient Location: [facility]  Anesthesia Type: General  Level of Consciousness: awake  Airway and Oxygen Therapy: Patient Spontanous Breathing  Post-op Pain: mild  Post-op Assessment: Post-op Vital signs reviewed  Post-op Vital Signs: Reviewed and stable  Complications: No apparent anesthesia complications

## 2012-04-09 NOTE — Progress Notes (Signed)
1 Day Post-Op Procedure(s) (LRB): ROBOTIC ASSISTED MYOMECTOMY (N/A) ROBOTIC ASSISTED LAPAROSCOPIC LYSIS OF ADHESION (N/A) DILATATION & CURETTAGE/HYSTEROSCOPY WITH THERMACHOICE ABLATION (N/A)  Subjective: Patient reports that pain is well managed.  Tolerating clear liquids and ready to eat "real food"  No nausea / vomiting.  Ambulating and voiding though she complains of dysuria. Hasn't passed flatus yet and is ambulating without dizziness or difficulty.  Objective: BP 129/78  Pulse 91  Temp(Src) 97.7 F (36.5 C) (Oral)  Resp 18  Ht 5' 4.5" (1.638 m)  Wt 184 lb (83.462 kg)  BMI 31.10 kg/m2  SpO2 98%  LMP 03/23/2012 Lungs: clear Heart: normal rate and rhythm Abdomen:soft and appropriately tender Extremities: Homans sign is negative, no sign of DVT Incision: all intact without evidence of infection  Assessment: s/p Procedure(s): ROBOTIC ASSISTED MYOMECTOMY ROBOTIC ASSISTED LAPAROSCOPIC LYSIS OF ADHESION DILATATION & CURETTAGE/HYSTEROSCOPY WITH THERMACHOICE ABLATION: stable, progressing well and anemia  Plan: Advance diet Encourage ambulation Discharge home  LOS: 1 day    Montee Tallman, PA-C 04/09/2012, 7:36 AM

## 2012-04-09 NOTE — Discharge Summary (Signed)
  Physician Discharge Summary  Patient ID: [name] MRN: [id number] DOB/AGE: 1961-11-06 51 y.o.  Admit date: 04/08/2012 Discharge date: 04/09/2012   Discharge Diagnoses: Symptomatic Uterine Fibroids, Active Problems:  * No active hospital problems. *    Operation: Robot Assisted Laparoscopic extensive lysis of adhesions, myomectomy, hysteroscopy with versapoint resection of submucosal fibroids, dilatation and curettage and Therma-Choice endometrial ablation.   Discharged Condition: Stable  Hospital Course: On the date of admission the patient underwent the aforementioned procedure, tolerating it well.  She resumed bowel and bladder function by post-operative day #1 and was deemed ready for discharge home.  Disposition: Discharged home to self care on post operative day #1  Discharge Medications:   [name], [name]  Home Medication Instructions FAO:[id number]   Printed on:04/09/12 0755  Medication Information                    Multiple Vitamins-Minerals (MULTIVITAMIN WITH MINERALS) tablet Take 1 tablet by mouth daily.             albuterol (PROVENTIL HFA;VENTOLIN HFA) 108 (90 BASE) MCG/ACT inhaler Inhale 2 puffs into the lungs every 6 (six) hours as needed for wheezing.           calcium citrate (CALCITRATE - DOSED IN MG ELEMENTAL CALCIUM) 950 MG tablet Take 1 tablet by mouth daily.           ferrous sulfate 325 (65 FE) MG tablet Take 325 mg by mouth 2 (two) times daily.           oxyCODONE-acetaminophen (ROXICET) 5-325 MG per tablet Take 1 tablet by mouth every 4 (four) hours as needed for pain.           HYDROcodone-acetaminophen (VICODIN) 5-500 MG per tablet Take 1 tablet by mouth every 6 (six) hours as needed for pain (Do not take with Percocet).           promethazine (PHENERGAN) 12.5 MG tablet Take 1 tablet (12.5 mg total) by mouth every 6 (six) hours as needed for nausea.           phenazopyridine (PYRIDIUM) 100 MG tablet Take 1 tablet (100 mg total) by  mouth 3 (three) times daily as needed for pain (for pain with urination).              Follow-up appointment with Dr. Estanislado Pandy 04/15/2012 @ 8:30 am   Signed: Patrick Jupiter 04/09/2012, 7:55 AM

## 2012-04-09 NOTE — Progress Notes (Signed)
Order for Percocet generated from [facility] as a part of patient's post-op medication.  Prescription did not print however, so another order for the same was generated.  Graeson Nouri J. Lowell Guitar, PA-C

## 2012-04-09 NOTE — Addendum Note (Signed)
Addendum  created 04/09/12 1031 by Algis Greenhouse, CRNA   Modules edited:Notes Section

## 2012-04-09 NOTE — Discharge Instructions (Signed)
Call [facility] @ [phone] if:  You have a temperature greater than or equal to 100.4 degrees Farenheit orally You have pain that is not made better by the pain medication given and taken as directed You have excessive bleeding or problems urinating  Take Colace (Docusate Sodium/Stool Softener) 100 mg 2-3 times daily while taking narcotic pain medicine to avoid constipation or until bowel movements are regular.  You may walk up steps You may shower  You may resume a regular diet Keep incisions clean and dry Avoid anything in vagina for 6 weeks (or until after your post-operative visit)   Follow up with Dr. Estanislado Pandy  04/15/12 @ 8:30 am

## 2012-04-14 ENCOUNTER — Telehealth: Payer: Self-pay | Admitting: Obstetrics and Gynecology

## 2012-04-15 ENCOUNTER — Encounter: Payer: Self-pay | Admitting: Obstetrics and Gynecology

## 2012-04-15 ENCOUNTER — Encounter: Payer: BC Managed Care – PPO | Admitting: Obstetrics and Gynecology

## 2012-04-15 ENCOUNTER — Ambulatory Visit (INDEPENDENT_AMBULATORY_CARE_PROVIDER_SITE_OTHER): Payer: BC Managed Care – PPO | Admitting: Obstetrics and Gynecology

## 2012-04-15 VITALS — BP 118/62 | Ht 64.0 in | Wt 177.0 lb

## 2012-04-15 DIAGNOSIS — D259 Leiomyoma of uterus, unspecified: Secondary | ICD-10-CM

## 2012-04-15 NOTE — Progress Notes (Signed)
Surgery: Robotic Myomectomy/ Versapoint/ Thermachoice  Date: 04/08/2012  Eating a regular diet without difficulty. Bowel movements are normal. Does c/o a heavy gas pain in her lower abd. Pain is controlled without any medications.  Bladder function is returned to normal. Vaginal bleeding: spotting Vaginal discharge: color brown   Pt c/o of soreness.   Subjective:     [name] is a 51 y.o. female who presents 1 week status post-op for fibroids. Pathology: benign fibroids, benign endometrium and EM polyp  The following portions of the patient's history were reviewed and updated as appropriate: allergies, current medications, past family history, past medical history, past social history, past surgical history and problem list.  Review of Systems Pertinent items are noted in HPI.   Objective:    BP 118/62  Ht 5\' 4"  (1.626 m)  Wt 177 lb (80.287 kg)  BMI 30.38 kg/m2  LMP 03/23/2012 Weight:  Wt Readings from Last 1 Encounters:  04/15/12 177 lb (80.287 kg)    BMI: Body mass index is 30.38 kg/(m^2).  General Appearance: Alert, appropriate appearance for age. No acute distress Lungs: clear to auscultation bilaterally Back: No CVA tenderness Cardiovascular: Regular rate and rhythm. S1, S2, no murmur Gastrointestinal: Soft, non-tender, no masses or organomegaly Incision/s: healing well Pelvic Exam: deferred  Assessment:    Doing well postoperatively. Operative findings again reviewed. Pathology report discussed.    Plan:     1. Continue any current medications. 2. Wound care discussed. 3. Activity restrictions: OOW until next visit 4. Anticipated return to work: work Physicist, medical provided. 5. Follow up: 2 weeks   [name] A MD 5/8/20134:17 PM

## 2012-04-22 ENCOUNTER — Encounter: Payer: BC Managed Care – PPO | Admitting: Obstetrics and Gynecology

## 2012-04-28 ENCOUNTER — Ambulatory Visit (INDEPENDENT_AMBULATORY_CARE_PROVIDER_SITE_OTHER): Payer: BC Managed Care – PPO | Admitting: Obstetrics and Gynecology

## 2012-04-28 ENCOUNTER — Encounter: Payer: Self-pay | Admitting: Obstetrics and Gynecology

## 2012-04-28 ENCOUNTER — Encounter: Payer: BC Managed Care – PPO | Admitting: Obstetrics and Gynecology

## 2012-04-28 VITALS — BP 106/58 | Wt 182.0 lb

## 2012-04-28 DIAGNOSIS — D259 Leiomyoma of uterus, unspecified: Secondary | ICD-10-CM

## 2012-04-28 NOTE — Progress Notes (Signed)
Surgery:Robotic Assisted myomectomy,Robotic assisted laparoscopic LOA, D&C, Thermachoice ablation  Date: 04/08/2012  Eating a regular diet with out  difficulty. Bowel movements are normal.  Pain is controlled without any medications.  Bladder function is returned to normal. Vaginal bleeding: spotting Vaginal discharge: Light Brown mucous.    Subjective:     [name] is a 51 y.o. female who presents 3 weeks status post of Robotic myomectomy / Endometrial ablation.  The following portions of the patient's history were reviewed and updated as appropriate: allergies, current medications, past family history, past medical history, past social history, past surgical history and problem list.  Review of Systems Pertinent items are noted in HPI.   Objective:    BP 106/58  Wt 182 lb (82.555 kg)  LMP 04/21/2012 Weight:  Wt Readings from Last 1 Encounters:  04/28/12 182 lb (82.555 kg)    BMI: There is no height on file to calculate BMI.  General Appearance: Alert, appropriate appearance for age. No acute distress Lungs: clear to auscultation bilaterally Back: No CVA tenderness Cardiovascular: Regular rate and rhythm. S1, S2, no murmur Gastrointestinal: Soft, non-tender, no masses or organomegaly Incision/s: healing well Pelvic Exam: Bimanual exam normal with uterine size approx. 10 weeks  Assessment:    Doing well postoperatively. Operative findings again reviewed. Pathology report discussed.    Plan:   1. Normal post-op exam 2. Wound care discussed. 3. Activity restrictions: none 4. 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 5. Follow up: February 2014 for [facility]    [facility] A MD 5/21/20133:22 PM

## 2012-05-26 ENCOUNTER — Institutional Professional Consult (permissible substitution): Payer: BC Managed Care – PPO | Admitting: Medical

## 2012-06-05 ENCOUNTER — Encounter: Payer: Self-pay | Admitting: Medical

## 2012-06-05 ENCOUNTER — Ambulatory Visit (INDEPENDENT_AMBULATORY_CARE_PROVIDER_SITE_OTHER): Payer: BC Managed Care – PPO | Admitting: Medical

## 2012-06-05 VITALS — BP 130/80 | HR 68 | Temp 97.8°F | Resp 16 | Wt 184.0 lb

## 2012-06-05 DIAGNOSIS — D649 Anemia, unspecified: Secondary | ICD-10-CM

## 2012-06-05 DIAGNOSIS — E669 Obesity, unspecified: Secondary | ICD-10-CM

## 2012-06-05 LAB — CBC WITH DIFFERENTIAL/PLATELET
Basophils Absolute: 0 10*3/uL (ref 0.0–0.1)
Eosinophils Relative: 2 % (ref 0–5)
HCT: 35.4 % — ABNORMAL LOW (ref 36.0–46.0)
Hemoglobin: 11.4 g/dL — ABNORMAL LOW (ref 12.0–15.0)
Lymphocytes Relative: 39 % (ref 12–46)
Lymphs Abs: 3.2 10*3/uL (ref 0.7–4.0)
MCV: 80.3 fL (ref 78.0–100.0)
Monocytes Absolute: 0.4 10*3/uL (ref 0.1–1.0)
Neutro Abs: 4.5 10*3/uL (ref 1.7–7.7)
RBC: 4.41 MIL/uL (ref 3.87–5.11)
RDW: 16.1 % — ABNORMAL HIGH (ref 11.5–15.5)
WBC: 8.3 10*3/uL (ref 4.0–10.5)

## 2012-06-05 NOTE — Progress Notes (Signed)
Subjective: Here for 2 issues.   She started with [employer] program March 2012.  Since then she has learned to eat healthy, exercise at least twice weekly with Zumba.  She has lost 53lb since last year.  She is at her goal weight of 184lb.  She has been as low as 176 lb prior but didn't feel good at that weight.  She is mentally and physical happy, and is proud of herself for getting to this weight.  She wants to maintain this weight.  She is requesting a letter today to submit to [employer] so she can be a lifetime member and work to maintain her weight.  By doing this she can avoid $40/mo fee, but continue going to the meetings and getting the emotional support that she has found so helpful.    She recently had fibroid surgery in May, and has had no more bleeding since then.  Needs her blood count rechecked.  She does continue to take iron 1-2 times per week.  No other c/o.  The following portions of the patient's history were reviewed and updated as appropriate: allergies, current medications, past family history, past medical history, past social history, past surgical history and problem list.  Past Medical History  Diagnosis Date  . Anemia   . Obesity   . History of uterine fibroid   . Shortness of breath     episode of "wheezing">1 mo ago    Allergies  Allergen Reactions  . Aspirin Hives    hives  . Shellfish Allergy Hives    Review of Systems ROS reviewed and was negative other than noted in HPI or above.    Objective:   Physical Exam  General appearance: alert, no distress, WD/WN Neck: supple, no lymphadenopathy, no thyromegaly, no masses Heart: RRR, normal S1, S2, no murmurs Lungs: CTA bilaterally, no wheezes, rhonchi, or rales Pulses: 2+ symmetric, upper and lower extremities, normal cap refill   Assessment and Plan :     Encounter Diagnoses  Name Primary?  . Obesity Yes  . Anemia    Obesity - congratulated her on her 53 lb weight loss.  We discussed  ideal weight vs her current weight.  She is comfortable physically and emotionally now. I think its is important for her to continue with the support of weight watchers and continue her lifestyle changes.  Although she is not technically at ideal weight, she plans to at least maintain her current weight which is her personal goal weight.  Wrote letter for her to submit to [employer] to get the discount.  Otherwise she may not be able to afford the program.   Anemia - recheck CBC today.

## 2012-07-17 ENCOUNTER — Telehealth: Payer: Self-pay | Admitting: Medical

## 2012-07-18 ENCOUNTER — Other Ambulatory Visit: Payer: Self-pay | Admitting: Medical

## 2012-07-18 MED ORDER — FERROUS SULFATE 325 (65 FE) MG PO TABS: 325.0000 mg | ORAL_TABLET | Freq: Two times a day (BID) | ORAL | Status: DC

## 2012-08-11 NOTE — Telephone Encounter (Signed)
TSD  

## 2013-01-14 ENCOUNTER — Other Ambulatory Visit: Payer: Self-pay | Admitting: Obstetrics and Gynecology

## 2013-01-14 DIAGNOSIS — Z1231 Encounter for screening mammogram for malignant neoplasm of breast: Secondary | ICD-10-CM

## 2013-02-05 ENCOUNTER — Ambulatory Visit: Payer: BC Managed Care – PPO | Admitting: Obstetrics and Gynecology

## 2013-02-05 ENCOUNTER — Encounter: Payer: Self-pay | Admitting: Obstetrics and Gynecology

## 2013-02-05 VITALS — BP 122/70 | HR 68 | Ht 64.5 in | Wt 188.0 lb

## 2013-02-05 DIAGNOSIS — Z124 Encounter for screening for malignant neoplasm of cervix: Secondary | ICD-10-CM

## 2013-02-05 DIAGNOSIS — Z86018 Personal history of other benign neoplasm: Secondary | ICD-10-CM

## 2013-02-05 DIAGNOSIS — N951 Menopausal and female climacteric states: Secondary | ICD-10-CM

## 2013-02-05 NOTE — Patient Instructions (Signed)
Menopause and Herbal Products Menopause is the normal time of life when menstrual periods stop completely. Menopause is complete when you have missed 12 consecutive menstrual periods. It usually occurs between the ages of 48 to 55, with an average age of 51. Very rarely does a woman develop menopause before 54 years old. At menopause, your ovaries stop producing the female hormones, estrogen and progesterone. This can cause undesirable symptoms and also affect your health. Sometimes the symptoms can occur 4 to 5 years before the menopause begins. There is no relationship between menopause and:  Oral contraceptives.  Number of children you had.  Race.  The age your menstrual periods started (menarche). Heavy smokers and very thin women may develop menopause earlier in life. Estrogen and progesterone hormone treatment is the usual method of treating menopausal symptoms. However, there are women who should not take hormone treatment. This is true of:   Women that have breast or uterine cancer.  Women who prefer not to take hormones because of certain side effects (abnormal uterine bleeding).  Women who are afraid that hormones may cause breast cancer.  Women who have a history of liver disease, heart disease, stroke, or blood clots. For these women, there are other medications that may help treat their menopausal symptoms. These medications are found in plants and botanical products. They can be found in the form of herbs, teas, oils, tinctures, and pills.  CAUSES:  The ovaries stop producing the female hormones estrogen and progesterone.  Other causes include:  Surgery to remove both ovaries.  The ovaries stop functioning for no know reason.  Tumors of the pituitary gland in the brain.  Medical disease that affects the ovaries and hormone production.  Radiation treatment to the abdomen or pelvis.  Chemotherapy that affects the ovaries. PHYTOESTROGENS: Phytoestrogen's occur  naturally in plants and plant products. They act like estrogen in the body. Herbal medications are made from these plants and botanical steroids. There are 3 types of phytoestrogens:  Isoflavones (genistein and daidzein) are found in soy, garbanzo beans, miso and tofu foods.  Ligins are found in the shell of seeds. They are used to make oils like flaxseed oil. The bacteria in your intestine act on these foods to produce the estrogen-like hormones.  Coumestans are estrogen-like. Some of the foods they are found in include sunflower seeds and bean sprouts. CONDITIONS AND THERE POSSIBLE HERBAL TREATMENT:  Hot flashes and night sweats.  Soy, black cohosh and evening primrose.  Irritability, insomnia, depression and memory problems.  Chasteberry, ginseng, and soy.  St. John's wort may be helpful for depression. However, there is a concern of it causing cataracts of the eye and may have bad effects on other medications. St. John's wort should not be taken for long time and without your caregiver's advice.  Loss of libido and vaginal and skin dryness.  Wild yam and soy.  Prevention of coronary heart disease and osteoporosis.  Soy and Isoflavones. Several studies have shown that some women benefit from herbal medications, but most of the studies have not consistently shown that these supplements are much better than placebo. Other forms of treatment to help women with menopausal symptoms include a balanced diet, rest, exercise, vitamin and calcium (with vitamin D) supplements, acupuncture, and group therapy when necessary. THOSE WHO SHOULD NOT TAKE HERBAL MEDICATIONS INCLUDE:  Women who are planning on getting pregnant unless told by your caregiver.  Women who are breastfeeding unless told by your caregiver.  Women who are taking other   prescription medications unless told by your caregiver.  Infants, children, and elderly women unless told by your caregiver. Different herbal medications  have different and unmeasured amounts of the herbal ingredients. There are no regulations, quality control, and standardization of the ingredients in herbal medications. Therefore, the amount of the ingredient in the medication may vary from one herb, pill, tea, oil or tincture to another. Many herbal medications can cause serious problems and can even have poisonous effects if taken too much or too long. If problems develop, the medication should be stopped and recorded by your caregiver. HOME CARE INSTRUCTIONS  Do not take or give children herbal medications without your caregiver's advice.  Let your caregiver know all the medications you are taking. This includes prescription, over-the-counter, eye drops, and creams.  Do not take herbal medications longer or more than recommended.  Tell your caregiver about any side effects from the medication. SEEK MEDICAL CARE IF:  You develop a fever of 102 F (38.9 C), or as directed by your caregiver.  You feel sick to your stomach (nauseous), vomit, or have diarrhea.  You develop a rash.  You develop abdominal pain.  You develop severe headaches.  You start to have vision problems.  You feel dizzy or faint.  You start to feel numbness in any part of your body.  You start shaking (have convulsions). Document Released: 05/13/2008 Document Revised: 02/17/2012 Document Reviewed: 12/11/2010 Exit[employer] Patient Information 2013 [employer].  

## 2013-02-05 NOTE — Progress Notes (Signed)
Last Pap: 02/04/12 WNL: Yes Regular Periods:no Contraception: BTL   Monthly Breast exam:yes Tetanus<84yrs:no pt unsure  Nl.Bladder Function:yes Daily BMs:yes Healthy Diet:yes Calcium:yes Mammogram:yes Date of Mammogram: 02/13/12 Exercise:yes Have often Exercise: 3 times per week  Seatbelt: yes Abuse at home: no Stressful work:no Sigmoid-colonoscopy: 4 yrs ago per pt  Bone Density: No PCP: Dr. Susann Givens Change in PMH: no changes  Change in FMH:no changes  BP 122/70  Pulse 68  Ht 5' 4.5" (1.638 m)  Wt 188 lb (85.276 kg)  BMI 31.78 kg/m2 Pt with complaints:yes.  She c/o hot flushes and insominia.  She has a very light period evry 1-3 months Physical Examination: General appearance - alert, well appearing, and in no distress Mental status - normal mood, behavior, speech, dress, motor activity, and thought processes Neck - supple, no significant adenopathy,  thyroid exam: thyroid is normal in size without nodules or tenderness Chest - clear to auscultation, no wheezes, rales or rhonchi, symmetric air entry Heart - normal rate and regular rhythm Abdomen - soft, nontender, nondistended, no masses or organomegaly Breasts - breasts appear normal, no suspicious masses, no skin or nipple changes or axillary nodes Pelvic - normal external genitalia, vulva, vagina, cervix, uterus and adnexa Rectal - normal rectal, no masses Back exam - full range of motion, no tenderness, palpable spasm or pain on motion Neurological - alert, oriented, normal speech, no focal findings or movement disorder noted Musculoskeletal - no joint tenderness, deformity or swelling Extremities - no edema, redness or tenderness in the calves or thighs Skin - normal coloration and turgor, no rashes, no suspicious skin lesions noted Routine exam Pap sent yes next due in three yrs Mammogram due yes.  Pt scheduled for this month  BTL used for contraception RT 74yr All treatment for menopausal sxs reviewed with the pt.  She desires to try natural remidies at this time

## 2013-02-08 LAB — PAP IG W/ RFLX HPV ASCU

## 2013-02-11 ENCOUNTER — Ambulatory Visit
Admission: RE | Admit: 2013-02-11 | Discharge: 2013-02-11 | Disposition: A | Discharge status: Home / Self Care | Payer: BC Managed Care – PPO | Source: Ambulatory Visit | Attending: Obstetrics and Gynecology | Admitting: Obstetrics and Gynecology

## 2013-02-11 DIAGNOSIS — Z1231 Encounter for screening mammogram for malignant neoplasm of breast: Secondary | ICD-10-CM

## 2013-06-17 ENCOUNTER — Encounter: Payer: Self-pay | Admitting: Family Medicine

## 2013-06-17 ENCOUNTER — Ambulatory Visit (INDEPENDENT_AMBULATORY_CARE_PROVIDER_SITE_OTHER): Payer: BC Managed Care – PPO | Admitting: Family Medicine

## 2013-06-17 VITALS — BP 110/70 | HR 68 | Temp 98.2°F | Ht 64.0 in | Wt 189.0 lb

## 2013-06-17 DIAGNOSIS — H1031 Unspecified acute conjunctivitis, right eye: Secondary | ICD-10-CM

## 2013-06-17 DIAGNOSIS — H1030 Unspecified acute conjunctivitis, unspecified eye: Secondary | ICD-10-CM

## 2013-06-17 MED ORDER — PREDNISOLONE ACETATE 1 % OP SUSP: 1.0000 [drp] | Freq: Four times a day (QID) | OPHTHALMIC | Status: DC

## 2013-06-17 NOTE — Patient Instructions (Signed)
I recommend a trial of an antihistamine eye drop.  These will say "for allergies".  Examples are Naphcon-A or visine A. If you are not improving (ongoing redness, itching, discomfort), then fill the prescription for the steroid drops. You must call us if you develop crusting, yellow drainage, or other worsening symptoms as we discussed (eye pain, loss of vision, etc).

## 2013-06-17 NOTE — Progress Notes (Signed)
Chief Complaint  Patient presents with  . Eye Problem    x 2 days has been running and itchy. Thinks she may have got some of a new keratin hair shampooo in her eye, but she is unsure of that could be the problem.   Got some shampoo in her right eye 2 days ago.  It got very irritated.  She tried flushing it out, but remained irritated and red.  No vision changes, foreign body sensation, crusting, just ongoing watering and redness.  She wears glasses, no contacts.  She hasn't tried any OTC meds/drops.    Past Medical History  Diagnosis Date  . Anemia   . Obesity   . History of uterine fibroid   . Shortness of breath     episode of "wheezing">1 mo ago   Past Surgical History  Procedure Laterality Date  . Tubal ligation    . Myomectomy  11/1998  . Tonsilectomy, adenoidectomy, bilateral myringotomy and tubes    . Hysteroscopy d and c    . Robot assisted myomectomy  04/08/2012    Procedure: ROBOTIC ASSISTED MYOMECTOMY;  Surgeon: Esmeralda Arthur, MD;  Location: [facility];  Service: Gynecology;  Laterality: N/A;   History   Social History  . Marital Status: Single    Spouse Name: N/A    Number of Children: N/A  . Years of Education: N/A   Occupational History  . Not on file.   Social History Main Topics  . Smoking status: Former Smoker    Types: Cigarettes    Quit date: 12/10/1983  . Smokeless tobacco: Never Used  . Alcohol Use: 1.2 oz/week    2 Cans of beer per week     Comment: occasionally  . Drug Use: No  . Sexually Active: Not Currently -- Female partner(s)    Birth Control/ Protection: Other-see comments     Comment: BTL   Other Topics Concern  . Not on file   Social History Narrative  . No narrative on file   Current Outpatient Prescriptions on File Prior to Visit  Medication Sig Dispense Refill  . calcium citrate (CALCITRATE - DOSED IN MG ELEMENTAL CALCIUM) 950 MG tablet Take 1 tablet by mouth daily.      . diphenhydramine-acetaminophen (TYLENOL PM) 25-500 MG TABS  Take 1 tablet by mouth at bedtime as needed.      . ferrous sulfate 325 (65 FE) MG tablet Take 1 tablet (325 mg total) by mouth 2 (two) times daily.  60 tablet  2  . Multiple Vitamins-Minerals (MULTIVITAMIN WITH MINERALS) tablet Take 1 tablet by mouth daily.         No current facility-administered medications on file prior to visit.   Allergies  Allergen Reactions  . Aspirin Hives    hives  . Shellfish Allergy Hives   ROS: Denies any facial swelling, fevers, URI symptoms or other concerns. No GI complaints, headaches, chest pain, bleeding/bruising or rashes.  PHYSICAL EXAM: BP 110/70  Pulse 68  Temp(Src) 98.2 F (36.8 C) (Oral)  Ht 5\' 4"  (1.626 m)  Wt 189 lb (85.73 kg)  BMI 32.43 kg/m2 Pleasant female in no acute distress Right eye--diffusely moderately injected.  No fluorescein uptake. Watery eye, no crusting or purulence.  No soft tissue swelling or erythema EOMI Neck: no lymphadenopathy  ASSESSMENT/PLAN:  Conjunctivitis, acute, right - Plan: prednisoLONE acetate (PRED FORTE) 1 % ophthalmic suspension  Right eye conjunctivitis--allergic, due to contact.  No evidence of corneal abrasion or infection at this time.  Try OTC antihistamines +/- saline or other lubricant drop.  If not improving, then fill rx for steroid drops.  Reviewed signs/symptoms of infection and concern (ie vision changes, purulent drainage, swelling, pain, etc.) to return.  If redness spreads to other eye, and crusting develops, okay to call for ABX drops.

## 2013-06-18 ENCOUNTER — Encounter: Payer: Self-pay | Admitting: Family Medicine

## 2013-06-21 ENCOUNTER — Telehealth: Payer: Self-pay | Admitting: Family Medicine

## 2013-06-21 NOTE — Telephone Encounter (Signed)
The steroid drops were already sent to her pharmacy (day of her visit), to be used if the OTC wasn't helpful.  Did she already try the prednisolone?  If so, then likely needs ABX drops.  Is crusting just mild (like watery eyes drying?) or thick, yellow like pinkeye?

## 2013-06-21 NOTE — Telephone Encounter (Signed)
Yes.  F/u with ophtho (may simply need an antibiotic, but staying limited to just one eye, and not having significant purulence, there could be other reasons that ophtho needs to evaluate)

## 2013-06-21 NOTE — Telephone Encounter (Signed)
Spoke with patient and the crusting is mild, happens everytime she wakes up. She awakens in the night to use the restroom and her eye is crusted shut, dry and flaky not thick or yellow. Sometimes sees a white mucus in the corner of her eye. Eye is still tearing pretty badly and still red.

## 2013-06-21 NOTE — Telephone Encounter (Signed)
Did she ever fill the steroid drops we sent in at her visit?  If so, then she needs to schedule visit with ophtho.  If not, go ahead and fill steroid drops.  If not improved in 2-3 days, see ophtho.

## 2013-06-21 NOTE — Telephone Encounter (Signed)
I do apologize, I left it out of the message, she did pick up the steroid eye drops and they are not helping. So you want her to call and schedule appt with optho? Thanks.

## 2013-06-21 NOTE — Telephone Encounter (Signed)
Patient advised.

## 2014-01-04 ENCOUNTER — Other Ambulatory Visit: Payer: Self-pay

## 2014-01-04 DIAGNOSIS — Z1231 Encounter for screening mammogram for malignant neoplasm of breast: Secondary | ICD-10-CM

## 2014-01-31 ENCOUNTER — Ambulatory Visit: Payer: BC Managed Care – PPO | Admitting: Medical

## 2014-02-21 ENCOUNTER — Ambulatory Visit
Admission: RE | Admit: 2014-02-21 | Discharge: 2014-02-21 | Disposition: A | Discharge status: Home / Self Care | Payer: BC Managed Care – PPO | Source: Ambulatory Visit

## 2014-02-21 DIAGNOSIS — Z1231 Encounter for screening mammogram for malignant neoplasm of breast: Secondary | ICD-10-CM

## 2014-02-22 ENCOUNTER — Encounter: Payer: Self-pay | Admitting: Medical

## 2014-02-22 ENCOUNTER — Ambulatory Visit (INDEPENDENT_AMBULATORY_CARE_PROVIDER_SITE_OTHER): Payer: BC Managed Care – PPO | Admitting: Medical

## 2014-02-22 VITALS — BP 138/80 | HR 68 | Temp 98.3°F | Resp 14 | Ht 63.2 in | Wt 194.0 lb

## 2014-02-22 DIAGNOSIS — Z0000 Encounter for general adult medical examination without abnormal findings: Secondary | ICD-10-CM

## 2014-02-22 DIAGNOSIS — D649 Anemia, unspecified: Secondary | ICD-10-CM

## 2014-02-22 DIAGNOSIS — E669 Obesity, unspecified: Secondary | ICD-10-CM

## 2014-02-22 LAB — CBC WITH DIFFERENTIAL/PLATELET
Basophils Absolute: 0 10*3/uL (ref 0.0–0.1)
Basophils Relative: 0 % (ref 0–1)
EOS ABS: 0.1 10*3/uL (ref 0.0–0.7)
EOS PCT: 2 % (ref 0–5)
HEMATOCRIT: 40.9 % (ref 36.0–46.0)
HEMOGLOBIN: 13.5 g/dL (ref 12.0–15.0)
LYMPHS ABS: 2.4 10*3/uL (ref 0.7–4.0)
LYMPHS PCT: 35 % (ref 12–46)
MCH: 27.4 pg (ref 26.0–34.0)
MCHC: 33 g/dL (ref 30.0–36.0)
MCV: 83 fL (ref 78.0–100.0)
MONOS PCT: 5 % (ref 3–12)
Monocytes Absolute: 0.3 10*3/uL (ref 0.1–1.0)
Neutro Abs: 4 10*3/uL (ref 1.7–7.7)
Neutrophils Relative %: 58 % (ref 43–77)
Platelets: 261 10*3/uL (ref 150–400)
RBC: 4.93 MIL/uL (ref 3.87–5.11)
RDW: 14.7 % (ref 11.5–15.5)
WBC: 6.9 10*3/uL (ref 4.0–10.5)

## 2014-02-22 LAB — COMPREHENSIVE METABOLIC PANEL
ALBUMIN: 4 g/dL (ref 3.5–5.2)
ALT: 13 U/L (ref 0–35)
AST: 20 U/L (ref 0–37)
Alkaline Phosphatase: 50 U/L (ref 39–117)
BUN: 16 mg/dL (ref 6–23)
CALCIUM: 9.5 mg/dL (ref 8.4–10.5)
CHLORIDE: 104 meq/L (ref 96–112)
CO2: 27 meq/L (ref 19–32)
CREATININE: 0.66 mg/dL (ref 0.50–1.10)
Glucose, Bld: 81 mg/dL (ref 70–99)
POTASSIUM: 4.1 meq/L (ref 3.5–5.3)
Sodium: 138 mEq/L (ref 135–145)
TOTAL PROTEIN: 7 g/dL (ref 6.0–8.3)
Total Bilirubin: 0.6 mg/dL (ref 0.2–1.2)

## 2014-02-22 LAB — LIPID PANEL
CHOLESTEROL: 141 mg/dL (ref 0–200)
HDL: 60 mg/dL (ref >39)
LDL Cholesterol: 68 mg/dL (ref 0–99)
Total CHOL/HDL Ratio: 2.4 Ratio
Triglycerides: 64 mg/dL (ref <150)
VLDL: 13 mg/dL (ref 0–40)

## 2014-02-22 LAB — POCT URINALYSIS DIPSTICK
BILIRUBIN UA: NEGATIVE
Blood, UA: NEGATIVE
Glucose, UA: NEGATIVE
KETONES UA: NEGATIVE
Nitrite, UA: NEGATIVE
PH UA: 5
Protein, UA: NEGATIVE
Spec Grav, UA: 1.02
Urobilinogen, UA: NEGATIVE

## 2014-02-22 NOTE — Progress Notes (Addendum)
Subjective:   HPI  [name] is a 53 y.o. female who presents for a complete physical.  Medical care team includes:  Dr. Fayne Mediate, gynecology  Dorothea Ogle, PA-C  Dr. Archie Balboa, dentist  Dr. Delman Cheadle, opthalmology   Preventative care: Last ophthalmology visit:yes-unsure of name, Dr. Delman Cheadle Last dental visit:yes-Dr. Archie Balboa Last colonoscopy: 03/26/10 Last mammogram:02/21/14 Last gynecological exam:02/11/14 Last EKG:?2008 Last labs:cbc/2013  Prior vaccinations: TD or Tdap:08/01/09 Influenza:2014 Pneumococcal:unsure Shingles/Zostavax:n/a  Advanced directive:n/a Health care power of attorney:n/a Living will:n/a  Concerns: Had 1 recent episode of left leg pain and weakness.   Had started new exercise, went to bed that night with some left leg pain, next morning, leg didn't wont' to work, but only lasted 1 day and is back to normal.  Starting back exercise today.  Using some diet discretion.  Plans to get back into weight watchers program.  Reviewed their medical, surgical, family, social, medication, and allergy history and updated chart as appropriate.  Past Medical History  Diagnosis Date  . Anemia   . Obesity   . History of uterine fibroid   . Wears glasses   . Wears dentures     partial    Past Surgical History  Procedure Laterality Date  . Tubal ligation    . Myomectomy  11/1998  . Tonsilectomy, adenoidectomy, bilateral myringotomy and tubes    . Hysteroscopy d and c    . Robot assisted myomectomy  04/08/2012    Procedure: ROBOTIC ASSISTED MYOMECTOMY;  Surgeon: Alwyn Pea, MD;  Location: [facility];  Service: Gynecology;  Laterality: N/A;  . Colonoscopy      age 38 yo    History   Social History  . Marital Status: Single    Spouse Name: N/A    Number of Children: N/A  . Years of Education: N/A   Occupational History  . Not on file.   Social History Main Topics  . Smoking status: Former Smoker -- 0.25 packs/day for 15 years    Types:  Cigarettes    Quit date: 12/10/1983  . Smokeless tobacco: Never Used  . Alcohol Use: 0.0 oz/week     Comment: occasionally  . Drug Use: No  . Sexual Activity: Not Currently    Partners: Male    Birth Control/ Protection: Other-see comments     Comment: BTL   Other Topics Concern  . Not on file   Social History Narrative   Exercise - starting back as of 02/2014.  Divorced, no significant other, lives with grand-daughter.  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 Heart disease Mother   . Diabetes Mother   . Hyperlipidemia Mother   . Eclampsia Mother   . Seizures Mother     died of seizure  . Asthma Sister   . Hypertension Sister   . Cancer Maternal Grandmother   . Hypertension Father   . Gout Father     Current outpatient prescriptions:calcium citrate (CALCITRATE - DOSED IN MG ELEMENTAL CALCIUM) 950 MG tablet, Take 1 tablet by mouth daily., Disp: , Rfl: ;  Multiple Vitamins-Minerals (MULTIVITAMIN WITH MINERALS) tablet, Take 1 tablet by mouth daily.  , Disp: , Rfl: ;  diphenhydramine-acetaminophen (TYLENOL PM) 25-500 MG TABS, Take 1 tablet by mouth at bedtime as needed., Disp: , Rfl:  ferrous sulfate 325 (65 FE) MG tablet, Take 1 tablet (325 mg total) by mouth 2 (two) times  daily., Disp: 60 tablet, Rfl: 2  Allergies  Allergen Reactions  . Aspirin Hives    hives  . Shellfish Allergy Hives    Review of Systems Constitutional: -fever, -chills, -sweats, +unexpected weight change, -decreased appetite, -fatigue Allergy: -sneezing, -itching, -congestion Dermatology: -changing moles, --rash, -lumps ENT: -runny nose, -ear pain, -sore throat, -hoarseness, -sinus pain, -teeth pain, - ringing in ears, -hearing loss, -nosebleeds Cardiology: -chest pain, -palpitations, -swelling, -difficulty breathing when lying flat, -waking up short of breath Respiratory: -cough, -shortness of breath,  -difficulty breathing with exercise or exertion, -wheezing, -coughing up blood Gastroenterology: -abdominal pain, -nausea, -vomiting, -diarrhea, -constipation, -blood in stool, -changes in bowel movement, -difficulty swallowing or eating Hematology: -bleeding, -bruising  Musculoskeletal: -joint aches, -muscle aches, -joint swelling, -back pain, -neck pain, -cramping, -changes in gait Ophthalmology: denies vision changes, eye redness, itching, discharge Urology: -burning with urination, -difficulty urinating, -blood in urine, -urinary frequency, -urgency, -incontinence Neurology: -headache, -weakness, -tingling, -numbness, -memory loss, -falls, -dizziness Psychology: -depressed mood, -agitation, -sleep problems     Objective:   Physical Exam  BP 138/80  Pulse 68  Temp(Src) 98.3 F (36.8 C) (Oral)  Resp 14  Ht 5' 3.2" (1.605 m)  Wt 194 lb (87.998 kg)  BMI 34.16 kg/m2  General appearance: alert, no distress, WD/WN, pleasant AA female Skin: few scattered macules, no worrisome lesions HEENT: normocephalic, conjunctiva/corneas normal, sclerae anicteric, PERRLA, EOMi, nares patent, no discharge or erythema, pharynx normal Oral cavity: MMM, tongue normal, teeth - upper denture, good repair Neck: supple, no lymphadenopathy, no thyromegaly, no masses, normal ROM, no bruits Chest: non tender, normal shape and expansion Heart: RRR, normal S1, S2, no murmurs Lungs: CTA bilaterally, no wheezes, rhonchi, or rales Abdomen: +bs, soft, few surgical port scars, non tender, non distended, no masses, no hepatomegaly, no splenomegaly, no bruits Back: non tender, normal ROM, no scoliosis Musculoskeletal: upper extremities non tender, no obvious deformity, normal ROM throughout, lower extremities non tender, no obvious deformity, normal ROM throughout Extremities: spider veins and mild varicosities of bilat proximal lower legs, no edema, no cyanosis, no clubbing Pulses: 2+ symmetric, upper and lower  extremities, normal cap refill Neurological: alert, oriented x 3, CN2-12 intact, strength normal upper extremities and lower extremities, sensation normal throughout, DTRs 2+ throughout, no cerebellar signs, gait normal Psychiatric: normal affect, behavior normal, pleasant  Breast/gyn/rectal - deferred to gynecology   Assessment and Plan :    Encounter Diagnoses  Name Primary?  . Routine general medical examination at a health care facility Yes  . Anemia   . Obesity, unspecified     Physical exam - discussed healthy lifestyle, diet, exercise, preventative care, vaccinations, and addressed their concerns.  Handout given. Advised routine eye doctor and dentist visits, colonoscopy q10 years, routine yearly f/u with gynecology. We'll send home with stool cards x3 to return for guaiac testing. Anemia - labs today, likely was related to fibroids prior Obesity - advised weight loss through diet and exercise changes Follow-up pending labs

## 2014-02-22 NOTE — Patient Instructions (Addendum)
  Thank you for giving me the opportunity to serve you today.    Your diagnosis today includes: Encounter Diagnoses  Name Primary?  . Routine general medical examination at a health care facility Yes  . Anemia   . Obesity, unspecified     Specific recommendations today include:  Work on weight loss through healthy diet and routine exercise  Consider restarting weight watchers  We will call with lab results  See gynecology, eye doctor and dentist yearly  Get flu shot yearly in September  I recommend you take a baby aspirin 81 mg daily for heart health  I recommend getting 1200 mg of calcium daily through diet or supplement  I recommend getting 600 IUs of vitamin D daily over-the-counter  Follow up: pending labs

## 2014-02-23 LAB — VITAMIN D 25 HYDROXY (VIT D DEFICIENCY, FRACTURES): Vit D, 25-Hydroxy: 49 ng/mL (ref 30–89)

## 2014-02-23 LAB — TSH: TSH: 1.056 u[IU]/mL (ref 0.350–4.500)

## 2014-02-24 NOTE — Progress Notes (Signed)
Pt notified of lab results, and instructed to use Calcium 1200mg /d and Vit D [phone] IU/d, and to continue weight loss efforts per [employer]. WGL

## 2014-02-28 ENCOUNTER — Ambulatory Visit (INDEPENDENT_AMBULATORY_CARE_PROVIDER_SITE_OTHER): Payer: BC Managed Care – PPO | Admitting: Medical

## 2014-02-28 ENCOUNTER — Encounter: Payer: Self-pay | Admitting: Medical

## 2014-02-28 VITALS — BP 118/80 | HR 66 | Temp 97.8°F | Resp 14 | Wt 194.0 lb

## 2014-02-28 DIAGNOSIS — B354 Tinea corporis: Secondary | ICD-10-CM

## 2014-02-28 MED ORDER — CLOTRIMAZOLE-BETAMETHASONE 1-0.05 % EX CREA
1.0000 | TOPICAL_CREAM | Freq: Two times a day (BID) | CUTANEOUS | Status: DC
Start: 2014-02-28 — End: 2016-03-14

## 2014-02-28 NOTE — Progress Notes (Signed)
   Subjective:   [name] is a 53 y.o. female presenting on 02/28/2014 with ring worm  Has had 2-3 episodes of ring worm this past 6-8 months, got it from granddaughter.   although OTC cream seems to help, seems to get it over and over.  This time on left neck, had it on right arm prior. No other aggravating or relieving factors.  No other complaint.  Review of Systems ROS as in subjective      Objective:    Objective: Filed Vitals:   02/28/14 1134  BP: 118/80  Pulse: 66  Temp: 97.8 F (36.6 C)  Resp: 14    General appearance: alert, no distress, WD/WN Skin: left neck with 1.2 cm round rash with raised border and clearing center suggestive of ring worm      Assessment: Encounter Diagnosis  Name Primary?  . Tinea corporis Yes     Plan: Short term trial of Lotrisone.   Discussed risks/benefits, avoid overuse of medication.  Call/return if not improving.  Dulse was seen today for ring worm.  Diagnoses and associated orders for this visit:  Tinea corporis  Other Orders - clotrimazole-betamethasone (LOTRISONE) cream; Apply 1 application topically 2 (two) times daily.    Return if symptoms worsen or fail to improve.

## 2014-03-14 ENCOUNTER — Other Ambulatory Visit (INDEPENDENT_AMBULATORY_CARE_PROVIDER_SITE_OTHER): Payer: BC Managed Care – PPO

## 2014-03-14 DIAGNOSIS — D649 Anemia, unspecified: Secondary | ICD-10-CM

## 2014-03-14 LAB — HEMOCCULT GUIAC POC 1CARD (OFFICE)
FECAL OCCULT BLD: NEGATIVE
FECAL OCCULT BLD: NEGATIVE
FECAL OCCULT BLD: NEGATIVE

## 2014-03-20 ENCOUNTER — Emergency Department (HOSPITAL_COMMUNITY)
Admission: EM | Admit: 2014-03-20 | Discharge: 2014-03-20 | Disposition: A | Discharge status: Home / Self Care | Payer: BC Managed Care – PPO | Source: Home / Self Care | Attending: Family Medicine | Admitting: Family Medicine

## 2014-03-20 ENCOUNTER — Encounter (HOSPITAL_COMMUNITY): Payer: Self-pay | Admitting: Emergency Medicine

## 2014-03-20 DIAGNOSIS — A088 Other specified intestinal infections: Secondary | ICD-10-CM

## 2014-03-20 DIAGNOSIS — A084 Viral intestinal infection, unspecified: Secondary | ICD-10-CM

## 2014-03-20 MED ORDER — LOPERAMIDE HCL 2 MG PO CAPS: 2.0000 mg | ORAL_CAPSULE | Freq: Four times a day (QID) | ORAL | Status: DC | PRN

## 2014-03-20 MED ORDER — ONDANSETRON 4 MG PO TBDP
ORAL_TABLET | ORAL | Status: AC
  Filled 2014-03-20: qty 1

## 2014-03-20 MED ORDER — ONDANSETRON 4 MG PO TBDP
4.0000 mg | ORAL_TABLET | Freq: Once | ORAL | Status: AC
  Administered 2014-03-20: 4 mg via ORAL

## 2014-03-20 MED ORDER — ONDANSETRON 8 MG PO TBDP: 8.0000 mg | ORAL_TABLET | Freq: Three times a day (TID) | ORAL | Status: DC | PRN

## 2014-03-20 NOTE — Discharge Instructions (Signed)
Please followup with your primary care provider to have your abdominal aorta checked to make sure it is not enlarged. Your primary care provider will probably order an ultrasound or other imaging study to evaluate this if they feel is necessary. I do not believe that this is the cause of your symptoms today.  Diet for Diarrhea, Adult Frequent, runny stools (diarrhea) may be caused or worsened by food or drink. Diarrhea may be relieved by changing your diet. Since diarrhea can last up to 7 days, it is easy for you to lose too much fluid from the body and become dehydrated. Fluids that are lost need to be replaced. Along with a modified diet, make sure you drink enough fluids to keep your urine clear or pale yellow. DIET INSTRUCTIONS  Ensure adequate fluid intake (hydration): have 1 cup (8 oz) of fluid for each diarrhea episode. Avoid fluids that contain simple sugars or sports drinks, fruit juices, whole milk products, and sodas. Your urine should be clear or pale yellow if you are drinking enough fluids. Hydrate with an oral rehydration solution that you can purchase at pharmacies, retail stores, and online. You can prepare an oral rehydration solution at home by mixing the following ingredients together:    tsp table salt.   tsp baking soda.   tsp salt substitute containing potassium chloride.  1  tablespoons sugar.  1 L (34 oz) of water.  Certain foods and beverages may increase the speed at which food moves through the gastrointestinal (GI) tract. These foods and beverages should be avoided and include:  Caffeinated and alcoholic beverages.  High-fiber foods, such as raw fruits and vegetables, nuts, seeds, and whole grain breads and cereals.  Foods and beverages sweetened with sugar alcohols, such as xylitol, sorbitol, and mannitol.  Some foods may be well tolerated and may help thicken stool including:  Starchy foods, such as rice, toast, pasta, low-sugar cereal, oatmeal, grits,  baked potatoes, crackers, and bagels.   Bananas.   Applesauce.  Add probiotic-rich foods to help increase healthy bacteria in the GI tract, such as yogurt and fermented milk products. RECOMMENDED FOODS AND BEVERAGES Starches Choose foods with less than 2 g of fiber per serving.  Recommended:  White, [place], and pita breads, plain rolls, buns, bagels. Plain muffins, matzo. Soda, saltine, or graham crackers. Pretzels, melba toast, zwieback. Cooked cereals made with water: cornmeal, farina, cream cereals. Dry cereals: refined corn, wheat, rice. Potatoes prepared any way without skins, refined macaroni, spaghetti, noodles, refined rice.  Avoid:  Bread, rolls, or crackers made with whole wheat, multi-grains, rye, bran seeds, nuts, or coconut. Corn tortillas or taco shells. Cereals containing whole grains, multi-grains, bran, coconut, nuts, raisins. Cooked or dry oatmeal. Coarse wheat cereals, granola. Cereals advertised as "high-fiber." Potato skins. Whole grain pasta, wild or brown rice. Popcorn. Sweet potatoes, yams. Sweet rolls, doughnuts, waffles, pancakes, sweet breads. Vegetables  Recommended: Strained tomato and vegetable juices. Most well-cooked and canned vegetables without seeds. Fresh: Tender lettuce, cucumber without the skin, cabbage, spinach, bean sprouts.  Avoid: Fresh, cooked, or canned: Artichokes, baked beans, beet greens, broccoli, Brussels sprouts, corn, kale, legumes, peas, sweet potatoes. Cooked: Green or red cabbage, spinach. Avoid large servings of any vegetables because vegetables shrink when cooked, and they contain more fiber per serving than fresh vegetables. Fruit  Recommended: Cooked or canned: Apricots, applesauce, cantaloupe, cherries, fruit cocktail, grapefruit, grapes, kiwi, mandarin oranges, peaches, pears, plums, watermelon. Fresh: Apples without skin, ripe banana, grapes, cantaloupe, cherries, grapefruit, peaches, oranges, plums.  Keep servings limited to  cup  or 1 piece.  Avoid: Fresh: Apples with skin, apricots, mangoes, pears, raspberries, strawberries. Prune juice, stewed or dried prunes. Dried fruits, raisins, dates. Large servings of all fresh fruits. Protein  Recommended: Ground or well-cooked tender beef, ham, veal, lamb, pork, or poultry. Eggs. Fish, oysters, shrimp, lobster, other seafoods. Liver, organ meats.  Avoid: Tough, fibrous meats with gristle. Peanut butter, smooth or chunky. Cheese, nuts, seeds, legumes, dried peas, beans, lentils. Dairy  Recommended: Yogurt, lactose-free milk, kefir, drinkable yogurt, buttermilk, soy milk, or plain hard cheese.  Avoid: Milk, chocolate milk, beverages made with milk, such as milkshakes. Soups  Recommended: Bouillon, broth, or soups made from allowed foods. Any strained soup.  Avoid: Soups made from vegetables that are not allowed, cream or milk-based soups. Desserts and Sweets  Recommended: Sugar-free gelatin, sugar-free frozen ice pops made without sugar alcohol.  Avoid: Plain cakes and cookies, pie made with fruit, pudding, custard, cream pie. Gelatin, fruit, ice, sherbet, frozen ice pops. Ice cream, ice milk without nuts. Plain hard candy, honey, jelly, molasses, syrup, sugar, chocolate syrup, gumdrops, marshmallows. Fats and Oils  Recommended: Limit fats to less than 8 tsp per day.  Avoid: Seeds, nuts, olives, avocados. Margarine, butter, cream, mayonnaise, salad oils, plain salad dressings. Plain gravy, crisp bacon without rind. Beverages  Recommended: Water, decaffeinated teas, oral rehydration solutions, sugar-free beverages not sweetened with sugar alcohols.  Avoid: Fruit juices, caffeinated beverages (coffee, tea, soda), alcohol, sports drinks, or lemon-lime soda. Condiments  Recommended: Ketchup, mustard, horseradish, vinegar, cocoa powder. Spices in moderation: allspice, basil, bay leaves, celery powder or leaves, cinnamon, cumin powder, curry powder, ginger, mace,  marjoram, onion or garlic powder, oregano, paprika, parsley flakes, ground pepper, rosemary, sage, savory, tarragon, thyme, turmeric.  Avoid: Coconut, honey. Document Released: 02/15/2004 Document Revised: 08/19/2012 Document Reviewed: 04/10/2012 [facility] Patient Information 2014 [place].  Viral Gastroenteritis Viral gastroenteritis is also known as stomach flu. This condition affects the stomach and intestinal tract. It can cause sudden diarrhea and vomiting. The illness typically lasts 3 to 8 days. Most people develop an immune response that eventually gets rid of the virus. While this natural response develops, the virus can make you quite ill. CAUSES  Many different viruses can cause gastroenteritis, such as rotavirus or noroviruses. You can catch one of these viruses by consuming contaminated food or water. You may also catch a virus by sharing utensils or other personal items with an infected person or by touching a contaminated surface. SYMPTOMS  The most common symptoms are diarrhea and vomiting. These problems can cause a severe loss of body fluids (dehydration) and a body salt (electrolyte) imbalance. Other symptoms may include:  Fever.  Headache.  Fatigue.  Abdominal pain. DIAGNOSIS  Your caregiver can usually diagnose viral gastroenteritis based on your symptoms and a physical exam. A stool sample may also be taken to test for the presence of viruses or other infections. TREATMENT  This illness typically goes away on its own. Treatments are aimed at rehydration. The most serious cases of viral gastroenteritis involve vomiting so severely that you are not able to keep fluids down. In these cases, fluids must be given through an intravenous line (IV). HOME CARE INSTRUCTIONS   Drink enough fluids to keep your urine clear or pale yellow. Drink small amounts of fluids frequently and increase the amounts as tolerated.  Ask your caregiver for specific rehydration  instructions.  Avoid:  Foods high in sugar.  Alcohol.  Carbonated drinks.  Tobacco.  Juice.  Caffeine drinks.  Extremely hot or cold fluids.  Fatty, greasy foods.  Too much intake of anything at one time.  Dairy products until 24 to 48 hours after diarrhea stops.  You may consume probiotics. Probiotics are active cultures of beneficial bacteria. They may lessen the amount and number of diarrheal stools in adults. Probiotics can be found in yogurt with active cultures and in supplements.  Wash your hands well to avoid spreading the virus.  Only take over-the-counter or prescription medicines for pain, discomfort, or fever as directed by your caregiver. Do not give aspirin to children. Antidiarrheal medicines are not recommended.  Ask your caregiver if you should continue to take your regular prescribed and over-the-counter medicines.  Keep all follow-up appointments as directed by your caregiver. SEEK IMMEDIATE MEDICAL CARE IF:   You are unable to keep fluids down.  You do not urinate at least once every 6 to 8 hours.  You develop shortness of breath.  You notice blood in your stool or vomit. This may look like coffee grounds.  You have abdominal pain that increases or is concentrated in one small area (localized).  You have persistent vomiting or diarrhea.  You have a fever.  The patient is a child younger than 3 months, and he or she has a fever.  The patient is a child older than 3 months, and he or she has a fever and persistent symptoms.  The patient is a child older than 3 months, and he or she has a fever and symptoms suddenly get worse.  The patient is a baby, and he or she has no tears when crying. MAKE SURE YOU:   Understand these instructions.  Will watch your condition.  Will get help right away if you are not doing well or get worse. Document Released: 11/25/2005 Document Revised: 02/17/2012 Document Reviewed: 09/11/2011 [facility] Patient  Information 2014 [place].

## 2014-03-20 NOTE — ED Notes (Signed)
C/o diarrhea and vomiting since yesterday evening States she has abd pain States vomit smells rotten Tried taking Tums as tx

## 2014-03-20 NOTE — ED Provider Notes (Signed)
CSN: [id number]     Arrival date & time 03/20/14  1048 History   First MD Initiated Contact with Patient 03/20/14 1202     Chief Complaint  Patient presents with  . Emesis  . Diarrhea   (Consider location/radiation/quality/duration/timing/severity/associated sxs/prior Treatment) HPI Comments: 53 year old female presents complaining of vomiting and diarrhea since yesterday evening along with some cramping abdominal pains. The vomiting and diarrhea began before the abdominal pain. She rates the abdominal pain as mild to moderate, although it seems to have resolved at this time. She denies any recent travel or sick contacts. There is no blood in the diarrhea or vomitus. No other symptoms.  Patient is a 53 y.o. female presenting with vomiting and diarrhea.  Emesis Associated symptoms: abdominal pain and diarrhea   Associated symptoms: no chills   Diarrhea Associated symptoms: abdominal pain and vomiting   Associated symptoms: no chills and no fever     Past Medical History  Diagnosis Date  . Anemia   . Obesity   . History of uterine fibroid   . Wears glasses   . Wears dentures     partial   Past Surgical History  Procedure Laterality Date  . Tubal ligation    . Myomectomy  11/1998  . Tonsilectomy, adenoidectomy, bilateral myringotomy and tubes    . Hysteroscopy d and c    . Robot assisted myomectomy  04/08/2012    Procedure: ROBOTIC ASSISTED MYOMECTOMY;  Surgeon: Alwyn Pea, MD;  Location: [facility];  Service: Gynecology;  Laterality: N/A;  . Colonoscopy      age 33 yo   Family History  Problem Relation Age of Onset  . Heart disease Mother   . Diabetes Mother   . Hyperlipidemia Mother   . Eclampsia Mother   . Seizures Mother     died of seizure  . Asthma Sister   . Hypertension Sister   . Cancer Maternal Grandmother   . Hypertension Father   . Gout Father    History  Substance Use Topics  . Smoking status: Former Smoker -- 0.25 packs/day for 15 years    Types:  Cigarettes    Quit date: 12/10/1983  . Smokeless tobacco: Never Used  . Alcohol Use: 0.0 oz/week     Comment: occasionally   OB History   Grav Para Term Preterm Abortions TAB SAB Ect Mult Living   3 3 3       3      Review of Systems  Constitutional: Negative for fever and chills.  Gastrointestinal: Positive for nausea, vomiting, abdominal pain and diarrhea. Negative for blood in stool.  All other systems reviewed and are negative.   Allergies  Aspirin and Shellfish allergy  Home Medications   Current Outpatient Rx  Name  Route  Sig  Dispense  Refill  . calcium citrate (CALCITRATE - DOSED IN MG ELEMENTAL CALCIUM) 950 MG tablet   Oral   Take 1 tablet by mouth daily.         . clotrimazole-betamethasone (LOTRISONE) cream   Topical   Apply 1 application topically 2 (two) times daily.   30 g   0   . diphenhydramine-acetaminophen (TYLENOL PM) 25-500 MG TABS   Oral   Take 1 tablet by mouth at bedtime as needed.         . ferrous sulfate 325 (65 FE) MG tablet   Oral   Take 1 tablet (325 mg total) by mouth 2 (two) times daily.   60 tablet  2   . loperamide (IMODIUM) 2 MG capsule   Oral   Take 1 capsule (2 mg total) by mouth 4 (four) times daily as needed for diarrhea or loose stools.   12 capsule   0   . Multiple Vitamins-Minerals (MULTIVITAMIN WITH MINERALS) tablet   Oral   Take 1 tablet by mouth daily.           . ondansetron (ZOFRAN ODT) 8 MG disintegrating tablet   Oral   Take 1 tablet (8 mg total) by mouth every 8 (eight) hours as needed for nausea or vomiting.   12 tablet   0    BP 119/70  Pulse 95  Temp(Src) 98.4 F (36.9 C) (Oral)  Resp 18  SpO2 100% Physical Exam  Nursing note and vitals reviewed. Constitutional: She is oriented to person, place, and time. Vital signs are normal. She appears well-developed and well-nourished. No distress.  HENT:  Head: Normocephalic and atraumatic.  Pulmonary/Chest: Effort normal. No respiratory distress.   Abdominal: Normal appearance and bowel sounds are normal. She exhibits pulsatile midline mass. There is no hepatosplenomegaly. There is generalized tenderness (minimal). There is no CVA tenderness.  Neurological: She is alert and oriented to person, place, and time. She has normal strength. Coordination normal.  Skin: Skin is warm and dry. No rash noted. She is not diaphoretic.  Psychiatric: She has a normal mood and affect. Judgment normal.    ED Course  Procedures (including critical care time) Labs Review Labs Reviewed - No data to display Imaging Review No results found.   MDM   1. Viral gastroenteritis    Symptoms are consistent with viral gastroenteritis. Treat with Zofran and Imodium. Follow up when necessary if not improving.  She has an incidental finding of a possible enlarged abdominal aorta, has no pain and distal pulses are intact so I do not think this is the cause of her symptoms, I have sent a message to her primary care provider and she will followup there   Meds ordered this encounter  Medications  . ondansetron (ZOFRAN-ODT) disintegrating tablet 4 mg    Sig:   . ondansetron (ZOFRAN ODT) 8 MG disintegrating tablet    Sig: Take 1 tablet (8 mg total) by mouth every 8 (eight) hours as needed for nausea or vomiting.    Dispense:  12 tablet    Refill:  0    Order Specific Question:  Supervising Provider    Answer:  Billy Fischer [phone]  . loperamide (IMODIUM) 2 MG capsule    Sig: Take 1 capsule (2 mg total) by mouth 4 (four) times daily as needed for diarrhea or loose stools.    Dispense:  12 capsule    Refill:  0    Order Specific Question:  Supervising Provider    Answer:  Ihor Gully D [[id number]]      Liam Graham, PA-C 03/20/14 1226

## 2014-03-20 NOTE — ED Provider Notes (Signed)
Medical screening examination/treatment/procedure(s) were performed by resident physician or non-physician practitioner and as supervising physician I was immediately available for consultation/collaboration.   Pauline Good MD.   Billy Fischer, MD 03/20/14 2044

## 2014-03-21 ENCOUNTER — Other Ambulatory Visit: Payer: Self-pay | Admitting: Medical

## 2014-03-21 DIAGNOSIS — R198 Other specified symptoms and signs involving the digestive system and abdomen: Secondary | ICD-10-CM

## 2014-03-21 DIAGNOSIS — R1900 Intra-abdominal and pelvic swelling, mass and lump, unspecified site: Secondary | ICD-10-CM

## 2014-03-21 DIAGNOSIS — Z87891 Personal history of nicotine dependence: Secondary | ICD-10-CM

## 2014-03-22 ENCOUNTER — Other Ambulatory Visit: Payer: Self-pay | Admitting: Family Medicine

## 2014-03-22 ENCOUNTER — Telehealth: Payer: Self-pay | Admitting: Family Medicine

## 2014-03-22 DIAGNOSIS — I714 Abdominal aortic aneurysm, without rupture, unspecified: Secondary | ICD-10-CM

## 2014-03-22 NOTE — Telephone Encounter (Signed)
Message copied by Leonard Feigel L on Tue Mar 22, 2014  2:21 PM ------      Message from: Glade Lloyd, DAVID S      Created: Mon Mar 21, 2014  7:39 AM       Pls set patient up with abdominal ultrasound to eval for possible abdominal aortic aneurysm. She was seen in the ED and they felt a pulsatile mass in her abdomen, felt like it could be an aneurysm.             [name]      ----- Message -----         From: Liam Graham, PA-C         Sent: 03/20/2014  12:20 PM           To: Brandi Hurl, PA-C            I saw this patient in clinic today with viral gastroenteritis. Also incidental finding, she may have an enlarged abdominal aorta/AAA. I know this is very subjective but she does have a history of smoking so it may be worth getting a screening ultrasound or whatever you want to use to check that out. I have instructed her to follow up with you but I wanted to send you a message as well.            Thanks,      -Zack        ------

## 2014-03-22 NOTE — Telephone Encounter (Signed)
Patient is aware of her appointment for her U/S on 03/25/14 @ 915 am. CLS [facility] [phone]

## 2014-03-25 ENCOUNTER — Ambulatory Visit: Payer: BC Managed Care – PPO

## 2014-03-29 ENCOUNTER — Ambulatory Visit
Admission: RE | Admit: 2014-03-29 | Discharge: 2014-03-29 | Disposition: A | Discharge status: Home / Self Care | Payer: BC Managed Care – PPO | Source: Ambulatory Visit | Attending: Medical | Admitting: Medical

## 2014-03-29 DIAGNOSIS — I714 Abdominal aortic aneurysm, without rupture, unspecified: Secondary | ICD-10-CM

## 2014-03-30 ENCOUNTER — Encounter: Payer: Self-pay | Admitting: Family Medicine

## 2014-05-06 ENCOUNTER — Telehealth: Payer: Self-pay | Admitting: Medical

## 2014-05-06 NOTE — Telephone Encounter (Signed)
Patient is aware of [facility]. CLS

## 2014-05-06 NOTE — Telephone Encounter (Signed)
F/u app't please

## 2014-10-10 ENCOUNTER — Encounter (HOSPITAL_COMMUNITY): Payer: Self-pay | Admitting: Emergency Medicine

## 2015-01-17 ENCOUNTER — Other Ambulatory Visit (INDEPENDENT_AMBULATORY_CARE_PROVIDER_SITE_OTHER): Payer: BC Managed Care – PPO

## 2015-01-17 ENCOUNTER — Telehealth: Payer: Self-pay | Admitting: Medical

## 2015-01-17 DIAGNOSIS — Z23 Encounter for immunization: Secondary | ICD-10-CM

## 2015-01-17 NOTE — Telephone Encounter (Signed)
Pt came in to get a tb test done and dropped off a form to be filled out. Her last CPE was in march. I am sending form back in your folder.

## 2015-01-17 NOTE — Telephone Encounter (Signed)
i don't see any forms?

## 2015-01-17 NOTE — Telephone Encounter (Signed)
It's in your folder that [name] has not sent back yet.

## 2015-01-18 NOTE — Telephone Encounter (Signed)
See msg below

## 2015-01-18 NOTE — Telephone Encounter (Signed)
I received a [employer] health examination certificate to be completed.  Their last physical was 02/2014 almost a year ago  Required information includes an updated tuberculosis skin test, proof of vaccination or immunity to tetanus/diphtheria, hepatitis B and Mumps/measles/rubella.  I currently do not have proof of a recent PPD skin test or vaccinations.  Andria Frames - please check paper chart records too for vaccinations).  Requirements:  They will need to come by for a PPD placement on a day they can return in 48-72 hours later for PPD reading  They need to provide proof of vaccination or we can either vaccinate or get titers to see if they are immune  Once I have those pieces of information, then I can complete the form  She will also be due for updated physical in March

## 2015-01-19 NOTE — Telephone Encounter (Signed)
[name], patient is coming today to have PPD read. She thinks her vaccinations are current. She will sign release today and have them forwarded to you.

## 2015-01-19 NOTE — Telephone Encounter (Signed)
LM to [facility] 

## 2015-01-20 LAB — TB SKIN TEST
INDURATION: 0 mm
TB Skin Test: NEGATIVE

## 2015-01-24 ENCOUNTER — Encounter: Payer: Self-pay | Admitting: Internal Medicine

## 2015-02-23 ENCOUNTER — Other Ambulatory Visit: Payer: Self-pay | Admitting: Obstetrics and Gynecology

## 2015-02-23 DIAGNOSIS — Z1231 Encounter for screening mammogram for malignant neoplasm of breast: Secondary | ICD-10-CM

## 2015-02-28 ENCOUNTER — Ambulatory Visit
Admission: RE | Admit: 2015-02-28 | Discharge: 2015-02-28 | Disposition: A | Discharge status: Home / Self Care | Payer: BC Managed Care – PPO | Source: Ambulatory Visit | Attending: Obstetrics and Gynecology | Admitting: Obstetrics and Gynecology

## 2015-02-28 DIAGNOSIS — Z1231 Encounter for screening mammogram for malignant neoplasm of breast: Secondary | ICD-10-CM

## 2015-03-02 ENCOUNTER — Ambulatory Visit: Payer: BC Managed Care – PPO

## 2015-06-06 ENCOUNTER — Ambulatory Visit (INDEPENDENT_AMBULATORY_CARE_PROVIDER_SITE_OTHER): Payer: BC Managed Care – PPO | Admitting: Medical

## 2015-06-06 ENCOUNTER — Encounter: Payer: Self-pay | Admitting: Medical

## 2015-06-06 VITALS — BP 120/70 | HR 63 | Resp 15 | Wt 196.0 lb

## 2015-06-06 DIAGNOSIS — I868 Varicose veins of other specified sites: Secondary | ICD-10-CM | POA: Diagnosis not present

## 2015-06-06 DIAGNOSIS — I809 Phlebitis and thrombophlebitis of unspecified site: Secondary | ICD-10-CM

## 2015-06-06 DIAGNOSIS — M25611 Stiffness of right shoulder, not elsewhere classified: Secondary | ICD-10-CM

## 2015-06-06 DIAGNOSIS — M79601 Pain in right arm: Secondary | ICD-10-CM | POA: Diagnosis not present

## 2015-06-06 DIAGNOSIS — M7551 Bursitis of right shoulder: Secondary | ICD-10-CM | POA: Diagnosis not present

## 2015-06-06 DIAGNOSIS — I8390 Asymptomatic varicose veins of unspecified lower extremity: Secondary | ICD-10-CM

## 2015-06-06 DIAGNOSIS — M25511 Pain in right shoulder: Secondary | ICD-10-CM | POA: Diagnosis not present

## 2015-06-06 MED ORDER — HYDROCODONE-ACETAMINOPHEN 5-325 MG PO TABS: 1.0000 | ORAL_TABLET | Freq: Four times a day (QID) | ORAL | Status: DC | PRN

## 2015-06-06 NOTE — Patient Instructions (Signed)
Recommendations:  Bursitis, arm pain, decreased ROM of shoulder -  use arm sling for 1-2 hours at a time, ice, avoid lifting over 90 degrees, Tylenol or Hydrocodone for pain, follow up in 2 weeks   Varicose veins - wear compression hose daily, do routine walking and exercise   Phlebitis - use warm compresses, leg elevation   Bursitis Bursitis is a swelling and soreness (inflammation) of a fluid-filled sac (bursa) that overlies and protects a joint. It can be caused by injury, overuse of the joint, arthritis or infection. The joints most likely to be affected are the elbows, shoulders, hips and knees. HOME CARE INSTRUCTIONS   Apply ice to the affected area for 15-20 minutes each hour while awake for 2 days. Put the ice in a plastic bag and place a towel between the bag of ice and your skin.  Rest the injured joint as much as possible, but continue to put the joint through a full range of motion, 4 times per day. (The shoulder joint especially becomes rapidly "frozen" if not used.) When the pain lessens, begin normal slow movements and usual activities.  Only take over-the-counter or prescription medicines for pain, discomfort or fever as directed by your caregiver.  Your caregiver may recommend draining the bursa and injecting medicine into the bursa. This may help the healing process.  Follow all instructions for follow-up with your caregiver. This includes any orthopedic referrals, physical therapy and rehabilitation. Any delay in obtaining necessary care could result in a delay or failure of the bursitis to heal and chronic pain. SEEK IMMEDIATE MEDICAL CARE IF:   Your pain increases even during treatment.  You develop an oral temperature above 102 F (38.9 C) and have heat and inflammation over the involved bursa. MAKE SURE YOU:   Understand these instructions.  Will watch your condition.  Will get help right away if you are not doing well or get worse. Document Released:  11/22/2000 Document Revised: 02/17/2012 Document Reviewed: 02/14/2014 [facility] Patient Information 2015 [place], [place]. This information is not intended to replace advice given to you by your health care provider. Make sure you discuss any questions you have with your health care provider.    Varicose Veins Varicose veins are veins that have become enlarged and twisted. CAUSES This condition is the result of valves in the veins not working properly. Valves in the veins help return blood from the leg to the heart. If these valves are damaged, blood flows backwards and backs up into the veins in the leg near the skin. This causes the veins to become larger. People who are on their feet a lot, who are pregnant, or who are overweight are more likely to develop varicose veins. SYMPTOMS   Bulging, twisted-appearing, bluish veins, most commonly found on the legs.  Leg pain or a feeling of heaviness. These symptoms may be worse at the end of the day.  Leg swelling.  Skin color changes. DIAGNOSIS  Varicose veins can usually be diagnosed with an exam of your legs by your caregiver. He or she may recommend an ultrasound of your leg veins. TREATMENT  Most varicose veins can be treated at home.However, other treatments are available for people who have persistent symptoms or who want to treat the cosmetic appearance of the varicose veins. These include:  Laser treatment of very small varicose veins.  Medicine that is shot (injected) into the vein. This medicine hardens the walls of the vein and closes off the vein. This treatment is  called sclerotherapy. Afterwards, you may need to wear clothing or bandages that apply pressure.  Surgery. HOME CARE INSTRUCTIONS   Do not stand or sit in one position for long periods of time. Do not sit with your legs crossed. Rest with your legs raised during the day.  Wear elastic stockings or support hose. Do not wear other tight, encircling garments around the  legs, pelvis, or waist.  Walk as much as possible to increase blood flow.  Raise the foot of your bed at night with 2-inch blocks.  If you get a cut in the skin over the vein and the vein bleeds, lie down with your leg raised and press on it with a clean cloth until the bleeding stops. Then place a bandage (dressing) on the cut. See your caregiver if it continues to bleed or needs stitches. SEEK MEDICAL CARE IF:   The skin around your ankle starts to break down.  You have pain, redness, tenderness, or hard swelling developing in your leg over a vein.  You are uncomfortable due to leg pain. Document Released: 09/04/2005 Document Revised: 02/17/2012 Document Reviewed: 01/21/2011 [facility] Patient Information 2015 [place], [place]. This information is not intended to replace advice given to you by your health care provider. Make sure you discuss any questions you have with your health care provider.    Phlebitis Phlebitis is soreness and swelling (inflammation) of a vein. This can occur in your arms, legs, or torso (trunk), as well as deeper inside your body. Phlebitis is usually not serious when it occurs close to the surface of the body. However, it can cause serious problems when it occurs in a vein deeper inside the body. CAUSES  Phlebitis can be triggered by various things, including:   Reduced blood flow through your veins. This can happen with:  Bed rest over a long period.  Long-distance travel.  Injury.  Surgery.  Being overweight (obese) or pregnant.  Having an IV tube put in the vein and getting certain medicines through the vein.  Cancer and cancer treatment.  Use of illegal drugs taken through the vein.  Inflammatory diseases.  Inherited (genetic) diseases that increase the risk of blood clots.  Hormone therapy, such as birth control pills. SIGNS AND SYMPTOMS   Red, tender, swollen, and painful area on your skin. Usually, the area will be long and  narrow.  Firmness along the center of the affected area. This can indicate that a blood clot has formed.  Low-grade fever. DIAGNOSIS  A health care provider can usually diagnose phlebitis by examining the affected area and asking about your symptoms. To check for infection or blood clots, your health care provider may order blood tests or an ultrasound exam of the area. Blood tests and your family history may also indicate if you have an underlying genetic disease that causes blood clots. Occasionally, a piece of tissue is taken from the body (biopsy sample) if an unusual cause of phlebitis is suspected. TREATMENT  Treatment will vary depending on the severity of the condition and the area of the body affected. Treatment may include:  Use of a warm compress or heating pad.  Use of compression stockings or bandages.  Anti-inflammatory medicines.  Removal of any IV tube that may be causing the problem.  Medicines that kill germs (antibiotics) if an infection is present.  Blood-thinning medicines if a blood clot is suspected or present.  In rare cases, surgery may be needed to remove damaged sections of vein. HOME CARE  INSTRUCTIONS   Only take over-the-counter or prescription medicines as directed by your health care provider. Take all medicines exactly as prescribed.  Raise (elevate) the affected area above the level of your heart as directed by your health care provider.  Apply a warm compress or heating pad to the affected area as directed by your health care provider. Do not sleep with the heating pad.  Use compression stockings or bandages as directed. These will speed healing and prevent the condition from coming back.  If you are on blood thinners:  Get follow-up blood tests as directed by your health care provider.  Check with your health care provider before using any new medicines.  Carry a medical alert card or wear your medical alert jewelry to show that you are on  blood thinners.  For phlebitis in the legs:  Avoid prolonged standing or bed rest.  Keep your legs moving. Raise your legs when sitting or lying.  Do not smoke.  Women, particularly those over the age of 58, should consider the risks and benefits of taking the contraceptive pill. This kind of hormone treatment can increase your risk for blood clots.  Follow up with your health care provider as directed. SEEK MEDICAL CARE IF:   You have unusual bruising or any bleeding problems.  Your swelling or pain in the affected area is not improving.  You are on anti-inflammatory medicine, and you develop belly (abdominal) pain. SEEK IMMEDIATE MEDICAL CARE IF:   You have a sudden onset of chest pain or difficulty breathing.  You have a fever or persistent symptoms for more than 2-3 days.  You have a fever and your symptoms suddenly get worse. MAKE SURE YOU:  Understand these instructions.  Will watch your condition.  Will get help right away if you are not doing well or get worse. Document Released: 11/19/2001 Document Revised: 09/15/2013 Document Reviewed: 08/02/2013 [facility] Patient Information 2015 [place], [place]. This information is not intended to replace advice given to you by your health care provider. Make sure you discuss any questions you have with your health care provider.

## 2015-06-06 NOTE — Progress Notes (Signed)
Subjective: Chief Complaint  Patient presents with  . RIGHT HUMERUS ARM PAIN   Here for pain in right arm.  Uses tylenol.  Been achy for a while.  someday's has difficulty moving arm above her head or to reach behind her head.  Been having pains for about a month.  Feels achy in shoulder, but pain seems to be more humerus.  Worse pain raising arm.  No elbow or wrist pain.   Denies numbness or tingling.  No bruising or swelling.   No injury, no fall, no trauma.   No prior issues with the arm.   Works as a Scientist, clinical (histocompatibility and immunogenetics).  Right handed.   Does do a lot of work with arms on the job.  No other aggravating or relieving factors.   C/o spider veins, varicose veins, has questions about this.   Has knot of left back of leg.  No other complaint.  ROS as in subjective  Objective: BP 120/70 mmHg  Pulse 63  Resp 15  Wt 196 lb (88.905 kg)  Gen: wd, wn, nad Skin: no erythema, warmth or ecchymosis MSK: tender over right biceps tendon, otherwise non tender, pain with shoulder ROM over 90 degrees of flexion and abduction active, but minimal pain with passive ROM, somewhat decreased internal and external ROM of right shoulder, otherwise no laxity, negative special tests, rest of bilat UE unremarkable, non tender normal ROM Pulses: normal UE Ext: left posterior calf region with mild to moderate varicose veins, several spider veins, one tender palpable superficial vein c/w phlebitis Neuro: arm with normal strength, sensation and DTRs   Assessment: Encounter Diagnoses  Name Primary?  . Bursitis, shoulder, right Yes  . Right arm pain   . Decreased ROM of right shoulder   . Varicose veins   . Phlebitis     Plan: Bursitis, arm pain, decreased ROM of shoulder - advised relative rest, use arm sling for 1-2 hours at a time, ice, avoid lifting over 90 degrees, Tylenol or Hydrocodone for pain, f/u in 2wk  Varicose veins - advised compression hose daily, routine walking and exercise  Phlebitis - warm compresses,  discussed diagnosis, treatment recommendations, possible complications

## 2016-03-14 ENCOUNTER — Ambulatory Visit (INDEPENDENT_AMBULATORY_CARE_PROVIDER_SITE_OTHER): Payer: BC Managed Care – PPO | Admitting: Medical

## 2016-03-14 ENCOUNTER — Encounter: Payer: Self-pay | Admitting: Medical

## 2016-03-14 VITALS — BP 110/80 | HR 68 | Ht 64.0 in | Wt 195.0 lb

## 2016-03-14 DIAGNOSIS — Z0000 Encounter for general adult medical examination without abnormal findings: Secondary | ICD-10-CM | POA: Diagnosis not present

## 2016-03-14 LAB — COMPREHENSIVE METABOLIC PANEL
ALBUMIN: 3.9 g/dL (ref 3.6–5.1)
ALK PHOS: 53 U/L (ref 33–130)
ALT: 13 U/L (ref 6–29)
AST: 20 U/L (ref 10–35)
BILIRUBIN TOTAL: 0.6 mg/dL (ref 0.2–1.2)
BUN: 9 mg/dL (ref 7–25)
CALCIUM: 9.4 mg/dL (ref 8.6–10.4)
CO2: 26 mmol/L (ref 20–31)
Chloride: 105 mmol/L (ref 98–110)
Creat: 0.68 mg/dL (ref 0.50–1.05)
GLUCOSE: 81 mg/dL (ref 65–99)
POTASSIUM: 4.1 mmol/L (ref 3.5–5.3)
Sodium: 141 mmol/L (ref 135–146)
Total Protein: 7.1 g/dL (ref 6.1–8.1)

## 2016-03-14 LAB — CBC
HCT: 41.3 % (ref 35.0–45.0)
HEMOGLOBIN: 13.5 g/dL (ref 11.7–15.5)
MCH: 27.4 pg (ref 27.0–33.0)
MCHC: 32.7 g/dL (ref 32.0–36.0)
MCV: 83.9 fL (ref 80.0–100.0)
MPV: 10.3 fL (ref 7.5–12.5)
PLATELETS: 244 10*3/uL (ref 140–400)
RBC: 4.92 MIL/uL (ref 3.80–5.10)
RDW: 14.4 % (ref 11.0–15.0)
WBC: 7.4 10*3/uL (ref 4.0–10.5)

## 2016-03-14 LAB — LIPID PANEL
CHOL/HDL RATIO: 2.8 ratio (ref <=5.0)
CHOLESTEROL: 160 mg/dL (ref 125–200)
HDL: 57 mg/dL (ref >=46)
LDL Cholesterol: 86 mg/dL (ref <130)
TRIGLYCERIDES: 84 mg/dL (ref <150)
VLDL: 17 mg/dL (ref <30)

## 2016-03-14 NOTE — Progress Notes (Signed)
Subjective:   HPI  [name] is a 55 y.o. female who presents for a complete physical.  Medical care team includes:  Dr. Fayne Mediate, gynecology  Dorothea Ogle, PA-C  Dr. Archie Balboa, dentist  Dr. 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   Recently she stubbed left thumb in door jamb, has brown blood blister under nail  Reviewed their medical, surgical, family, social, medication, and allergy history and updated chart as appropriate.  Past Medical History  Diagnosis Date  . Anemia   . Obesity   . History of uterine fibroid   . Wears glasses   . Wears dentures     partial  . Immune to rubella 12/10/1983  . Immune to mumps 01/17/2006  . Immune to hepatitis B 03/20/2001  . Allergy     Past Surgical History  Procedure Laterality Date  . Tubal ligation    . Myomectomy  11/1998  . Tonsilectomy, adenoidectomy, bilateral myringotomy and tubes    . Hysteroscopy d and c    . Robot assisted myomectomy  04/08/2012    Procedure: ROBOTIC ASSISTED MYOMECTOMY;  Surgeon: Alwyn Pea, MD;  Location: [facility];  Service: Gynecology;  Laterality: N/A;  . Colonoscopy  03/2010    hyperplastic polyp, repeat 2021; Dr. Virgel Bouquet    Social History   Social History  . Marital Status: Single    Spouse Name: N/A  . Number of Children: N/A  . Years of Education: N/A   Occupational History  . Not on file.   Social History Main Topics  . Smoking status: Former Smoker -- 0.25 packs/day for 15 years    Types: Cigarettes    Quit date: 12/10/1983  . Smokeless tobacco: Never Used  . Alcohol Use: 0.0 oz/week     Comment: occasionally  . Drug Use: No  . Sexual Activity:    Partners: Male    Birth Control/ Protection: Other-see comments     Comment: BTL   Other Topics Concern  . Not on file    Social History Narrative   Has 3 child all adults.   1 child in [place], 2 in [place].  Exercise - starting back as of 02/2014.  Exercise - treadmill, elliptical.  [employer].  Divorced, no significant other, lives with grand-daughter.  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 Diabetes Mother   . Hyperlipidemia Mother   . Eclampsia Mother   . Seizures Mother     died of seizure  . Asthma Sister   . Hypertension Sister   . Diabetes Sister   . Cancer Maternal Grandmother   . Hypertension Maternal Grandmother   . Hypertension Father   . Gout Father   . Hypertension Maternal Grandfather   . Hypertension Paternal Grandmother   . Heart disease Neg Hx      Current outpatient prescriptions:  .  calcium citrate (CALCITRATE - DOSED IN MG ELEMENTAL CALCIUM) 950 MG tablet, Take 1 tablet by mouth daily., Disp: , Rfl:  .  Multiple Vitamins-Minerals (MULTIVITAMIN WITH MINERALS) tablet, Take 1 tablet by mouth daily.  , Disp: , Rfl:  .  diphenhydramine-acetaminophen (TYLENOL PM) 25-500 MG TABS, Take 1 tablet by mouth at  bedtime as needed. Reported on 03/14/2016, Disp: , Rfl:  .  HYDROcodone-acetaminophen (NORCO/VICODIN) 5-325 MG per tablet, Take 1 tablet by mouth every 6 (six) hours as needed for moderate pain. (Patient not taking: Reported on 03/14/2016), Disp: 30 tablet, Rfl: 0  Allergies  Allergen Reactions  . Aspirin Hives    hives  . Shellfish Allergy Hives    Review of Systems Constitutional: -fever, -chills, -sweats, -unexpected weight change, -decreased appetite, -fatigue Allergy: -sneezing, -itching, -congestion Dermatology: -changing moles, --rash, -lumps ENT: -runny nose, -ear pain, -sore throat, -hoarseness, -sinus pain, -teeth pain, - ringing in ears, -hearing loss, -nosebleeds Cardiology: -chest pain, -palpitations, -swelling, -difficulty  breathing when lying flat, -waking up short of breath Respiratory: -cough, -shortness of breath, -difficulty breathing with exercise or exertion, -wheezing, -coughing up blood Gastroenterology: -abdominal pain, -nausea, -vomiting, -diarrhea, -constipation, -blood in stool, -changes in bowel movement, -difficulty swallowing or eating Hematology: -bleeding, -bruising  Musculoskeletal: -joint aches, -muscle aches, -joint swelling, -back pain, -neck pain, -cramping, -changes in gait Ophthalmology: denies vision changes, eye redness, itching, discharge Urology: -burning with urination, -difficulty urinating, -blood in urine, -urinary frequency, -urgency, -incontinence Neurology: -headache, -weakness, -tingling, -numbness, -memory loss, -falls, -dizziness Psychology: -depressed mood, -agitation, -sleep problems     Objective:   Physical Exam  BP 110/80 mmHg  Pulse 68  Ht 5\' 4"  (1.626 m)  Wt 195 lb (88.451 kg)  BMI 33.46 kg/m2  General appearance: alert, no distress, WD/WN, pleasant AA female Skin: few scattered macules, no worrisome lesions, left thumb with dried brown blood blister tattooing HEENT: normocephalic, conjunctiva/corneas normal, sclerae anicteric, PERRLA, EOMi, nares patent, no discharge or erythema, pharynx normal Oral cavity: MMM, tongue normal, teeth - upper denture, good repair Neck: supple, no lymphadenopathy, no thyromegaly, no masses, normal ROM, no bruits Chest: non tender, normal shape and expansion Heart: RRR, normal S1, S2, no murmurs Lungs: CTA bilaterally, no wheezes, rhonchi, or rales Abdomen: +bs, soft, few surgical port scars, non tender, non distended, no masses, no hepatomegaly, no splenomegaly, no bruits Back: non tender, normal ROM, no scoliosis Musculoskeletal: upper extremities non tender, no obvious deformity, normal ROM throughout, lower extremities non tender, no obvious deformity, normal ROM throughout Extremities: spider veins and mild varicosities of  bilat proximal lower legs, no edema, no cyanosis, no clubbing Pulses: 2+ symmetric, upper and lower extremities, normal cap refill Neurological: alert, oriented x 3, CN2-12 intact, strength normal upper extremities and lower extremities, sensation normal throughout, DTRs 2+ throughout, no cerebellar signs, gait normal Psychiatric: normal affect, behavior normal, pleasant  Breast/gyn/rectal - deferred to gynecology   Adult ECG Report  Indication: screening, physical  Rate: 57 bpm  Rhythm: sinus bradycardia  QRS Axis: -1 degrees  PR Interval: 135ms  QRS Duration: 162ms  QTc: 322ms  Conduction Disturbances: none  Other Abnormalities: none  Patient's cardiac risk factors are: obesity (BMI >= 30 kg/m2).  EKG comparison: 2008  Narrative Interpretation: sinus bradycardia    Assessment and Plan :    Encounter Diagnosis  Name Primary?  . Encounter for health maintenance examination in adult Yes   Physical exam - discussed healthy lifestyle, diet, exercise, preventative care, vaccinations, and addressed their concerns.  Handout given. Advised routine eye doctor and dentist visits, colonoscopy q10 years, routine yearly f/u with gynecology.  Obesity - advised weight loss goals.  She is exercising and eating healthy She is up to date on Tdap.  advised that the staining from the subungual hematoma of thumb should gradually resolve Follow-up pending labs

## 2016-03-15 LAB — HEMOGLOBIN A1C
Hgb A1c MFr Bld: 5.9 % — ABNORMAL HIGH (ref <5.7)
MEAN PLASMA GLUCOSE: 123 mg/dL

## 2016-03-26 NOTE — Addendum Note (Signed)
Addended by: Billie Lade on: 03/26/2016 11:41 AM   Modules accepted: Orders, SmartSet

## 2016-11-19 ENCOUNTER — Encounter: Payer: Self-pay | Admitting: Medical

## 2016-11-19 ENCOUNTER — Ambulatory Visit (INDEPENDENT_AMBULATORY_CARE_PROVIDER_SITE_OTHER): Payer: BC Managed Care – PPO | Admitting: Medical

## 2016-11-19 VITALS — BP 142/80 | HR 69

## 2016-11-19 DIAGNOSIS — J309 Allergic rhinitis, unspecified: Secondary | ICD-10-CM | POA: Diagnosis not present

## 2016-11-19 DIAGNOSIS — I889 Nonspecific lymphadenitis, unspecified: Secondary | ICD-10-CM | POA: Diagnosis not present

## 2016-11-19 NOTE — Progress Notes (Signed)
Subjective: Chief Complaint  Patient presents with  . swollen neck    swelling in her neck    Here for lymph node swollen.  She notes that she has been in her usual state of healthy other than some allergy symptoms, runny nose, congestion, mild.   She was at the dentis 2 days ago and dentist had concerns about a possible swollen node in right neck.  She has not been noticing lumps or bumps.  Denies fever, night sweats, weight loss, body aches, skin changes or other new symptoms.   Has been in usual state of healthy, exercises 3+ times per week, been feeling healthy in general.  No other aggravating or relieving factors. No other complaint.  Past Medical History:  Diagnosis Date  . Allergy   . Anemia   . History of uterine fibroid   . Immune to hepatitis B 03/20/2001  . Immune to mumps 01/17/2006  . Immune to rubella 12/10/1983  . Obesity   . Wears dentures    partial  . Wears glasses     Past Surgical History:  Procedure Laterality Date  . COLONOSCOPY  03/2010   hyperplastic polyp, repeat 2021; Dr. Virgel Bouquet  . HYSTEROSCOPY D AND C    . MYOMECTOMY  11/1998  . ROBOT ASSISTED MYOMECTOMY  04/08/2012   Procedure: ROBOTIC ASSISTED MYOMECTOMY;  Surgeon: Alwyn Pea, MD;  Location: [facility];  Service: Gynecology;  Laterality: N/A;  . TONSILECTOMY, ADENOIDECTOMY, BILATERAL MYRINGOTOMY AND TUBES    . TUBAL LIGATION      Social History   Social History  . Marital status: Single    Spouse name: N/A  . Number of children: N/A  . Years of education: N/A   Occupational History  . Not on file.   Social History Main Topics  . Smoking status: Former Smoker    Packs/day: 0.25    Years: 15.00    Types: Cigarettes    Quit date: 12/10/1983  . Smokeless tobacco: Never Used  . Alcohol use 0.0 oz/week     Comment: occasionally  . Drug use: No  . Sexual activity: Not Currently    Partners: Male    Birth control/ protection: Other-see comments     Comment: BTL   Other Topics Concern   . Not on file   Social History Narrative   Has 3 child all adults.   1 child in [place], 2 in [place].  Exercise - starting back as of 02/2014.  Exercise - treadmill, elliptical.  [employer].  Divorced, no significant other, lives with grand-daughter.  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 Diabetes Mother   . Hyperlipidemia Mother   . Eclampsia Mother   . Seizures Mother     died of seizure  . Asthma Sister   . Hypertension Sister   . Diabetes Sister   . Cancer Maternal Grandmother   . Hypertension Maternal Grandmother   . Hypertension Father   . Gout Father   . Hypertension Maternal Grandfather   . Hypertension Paternal Grandmother   . Heart disease Neg Hx      Current Outpatient Prescriptions:  .  calcium citrate (CALCITRATE - DOSED IN MG ELEMENTAL CALCIUM) 950 MG tablet, Take 1 tablet by mouth daily., Disp: , Rfl:  .  diphenhydramine-acetaminophen (TYLENOL PM) 25-500 MG TABS, Take 1 tablet by mouth  at bedtime as needed. Reported on 03/14/2016, Disp: , Rfl:  .  Multiple Vitamins-Minerals (MULTIVITAMIN WITH MINERALS) tablet, Take 1 tablet by mouth daily.  , Disp: , Rfl:  .  HYDROcodone-acetaminophen (NORCO/VICODIN) 5-325 MG per tablet, Take 1 tablet by mouth every 6 (six) hours as needed for moderate pain. (Patient not taking: Reported on 11/19/2016), Disp: 30 tablet, Rfl: 0  Allergies  Allergen Reactions  . Aspirin Hives    hives  . Shellfish Allergy Hives    Objective: BP (!) 142/80   Pulse 69   SpO2 97%    General appearance: alert, no distress, WD/WN HEENT: normocephalic, sclerae anicteric, PERRLA, EOMi, nares patent, slight clear discharge, no erythema, pharynx normal Oral cavity: MMM, no lesions Neck: supple, small palpable lymph node right sublingual area, but no distinct worrisome lymphadenopathy, no thyromegaly, no  masses Heart: RRR, normal S1, S2, no murmurs Lungs: CTA bilaterally, no wheezes, rhonchi, or rales Abdomen: +bs, soft, non tender, non distended, no masses, no hepatomegaly, no splenomegaly Extremities: no edema, no cyanosis, no clubbing Pulses: 2+ symmetric, upper and lower extremities, normal cap refill   Assessment: Encounter Diagnoses  Name Primary?  . Lymphadenitis Yes  . Allergic rhinitis, unspecified chronicity, unspecified seasonality, unspecified trigger     Plan: No obvious abnormality.  Maybe some lymphadenitis due to allergic rhinitis or normal variant.   Advised that if the area in right neck gets bigger or not resolving within the next 2 weeks, then can recheck.  Discussed symptoms or exam findings that would cause concern such as enlarging node, new lumps or bumps, fever, weight loss, night sweats.  She sees gyn, up to date on gyn cancer screening and colonoscopy.  No worrisome other symptoms on ROS.   F/u prn.   Reassured.  Morton was seen today for swollen neck.  Diagnoses and all orders for this visit:  Lymphadenitis  Allergic rhinitis, unspecified chronicity, unspecified seasonality, unspecified trigger

## 2017-03-26 LAB — HM MAMMOGRAPHY

## 2017-03-28 LAB — HM PAP SMEAR: HM PAP: NEGATIVE

## 2017-04-17 ENCOUNTER — Encounter: Payer: BC Managed Care – PPO | Admitting: Medical

## 2017-05-01 ENCOUNTER — Encounter: Payer: BC Managed Care – PPO | Admitting: Medical

## 2017-05-08 ENCOUNTER — Ambulatory Visit (INDEPENDENT_AMBULATORY_CARE_PROVIDER_SITE_OTHER): Payer: BC Managed Care – PPO | Admitting: Medical

## 2017-05-08 ENCOUNTER — Encounter: Payer: Self-pay | Admitting: Medical

## 2017-05-08 VITALS — BP 124/82 | HR 64 | Ht 64.0 in | Wt 197.0 lb

## 2017-05-08 DIAGNOSIS — Z0000 Encounter for general adult medical examination without abnormal findings: Secondary | ICD-10-CM

## 2017-05-08 DIAGNOSIS — E669 Obesity, unspecified: Secondary | ICD-10-CM

## 2017-05-08 DIAGNOSIS — Z1211 Encounter for screening for malignant neoplasm of colon: Secondary | ICD-10-CM | POA: Insufficient documentation

## 2017-05-08 DIAGNOSIS — Z86018 Personal history of other benign neoplasm: Secondary | ICD-10-CM

## 2017-05-08 DIAGNOSIS — Z7189 Other specified counseling: Secondary | ICD-10-CM | POA: Diagnosis not present

## 2017-05-08 DIAGNOSIS — Z7185 Encounter for immunization safety counseling: Secondary | ICD-10-CM | POA: Insufficient documentation

## 2017-05-08 DIAGNOSIS — Z113 Encounter for screening for infections with a predominantly sexual mode of transmission: Secondary | ICD-10-CM | POA: Insufficient documentation

## 2017-05-08 LAB — COMPREHENSIVE METABOLIC PANEL
ALBUMIN: 4 g/dL (ref 3.6–5.1)
ALT: 16 U/L (ref 6–29)
AST: 19 U/L (ref 10–35)
Alkaline Phosphatase: 58 U/L (ref 33–130)
BUN: 12 mg/dL (ref 7–25)
CHLORIDE: 104 mmol/L (ref 98–110)
CO2: 22 mmol/L (ref 20–31)
CREATININE: 0.66 mg/dL (ref 0.50–1.05)
Calcium: 9.5 mg/dL (ref 8.6–10.4)
Glucose, Bld: 81 mg/dL (ref 65–99)
Potassium: 4.2 mmol/L (ref 3.5–5.3)
SODIUM: 139 mmol/L (ref 135–146)
TOTAL PROTEIN: 7 g/dL (ref 6.1–8.1)
Total Bilirubin: 0.5 mg/dL (ref 0.2–1.2)

## 2017-05-08 LAB — VITAMIN D 25 HYDROXY (VIT D DEFICIENCY, FRACTURES): VIT D 25 HYDROXY: 47 ng/mL (ref 30–100)

## 2017-05-08 NOTE — Patient Instructions (Signed)
Recommendations:  Check insurance coverage for Shingrix vaccine  Get flu shot yearly in September  Check insurance to see if they would cover a bone density scan to screen for osteoporosis  continue regular exercise  Eat a healthily low fat diet  Continue to see your gynecologist, eye doctor and dentist regularly  We will call with lab results

## 2017-05-08 NOTE — Progress Notes (Signed)
Subjective:   HPI  [name] is a 56 y.o. female who presents for a complete physical.  Medical care team includes:  Dr. Fayne Mediate, gynecology  Dorothea Ogle, PA-C   Dr. Archie Balboa, dentist  Dr. Delman Cheadle, opthalmology  ortho  Concerns: Doing well.  Still working to get weight off.  She is in group that challenges each other to maintain or lose weight.   Is exercising with water aerobics 3 days per week. Trying to eat healthy.  Her only issue is portion control.  Doesn't eat a lot of fast food or sweets.  No soda or sweet tea.  Saw ortho recently for pain in right leg, water aerobics,  ortho - cartilage  Had steroid shot in right knee.  No prior bone density scan  Reviewed their medical, surgical, family, social, medication, and allergy history and updated chart as appropriate.  Past Medical History:  Diagnosis Date  . Allergy   . Anemia   . History of uterine fibroid   . Immune to hepatitis B 03/20/2001  . Immune to mumps 01/17/2006  . Immune to rubella 12/10/1983  . Obesity   . Wears dentures    partial  . Wears glasses     Past Surgical History:  Procedure Laterality Date  . COLONOSCOPY  03/2010   hyperplastic polyp, repeat 2021; Dr. Virgel Bouquet  . HYSTEROSCOPY D AND C    . MYOMECTOMY  11/1998  . ROBOT ASSISTED MYOMECTOMY  04/08/2012   Procedure: ROBOTIC ASSISTED MYOMECTOMY;  Surgeon: Alwyn Pea, MD;  Location: [facility];  Service: Gynecology;  Laterality: N/A;  . TONSILECTOMY, ADENOIDECTOMY, BILATERAL MYRINGOTOMY AND TUBES    . TUBAL LIGATION      Social History   Social History  . Marital status: Single    Spouse name: N/A  . Number of children: N/A  . Years of education: N/A   Occupational History  . Not on file.   Social History Main Topics  . Smoking status: Former Smoker    Packs/day: 0.25    Years: 15.00    Types: Cigarettes    Quit date: 12/10/1983  . Smokeless tobacco: Never Used  . Alcohol use 0.6 oz/week    1 Glasses of wine  per week     Comment: occasionally  . Drug use: No  . Sexual activity: Not Currently    Partners: Male    Birth control/ protection: Other-see comments     Comment: BTL   Other Topics Concern  . Not on file   Social History Narrative   Has 3 children, all adults.   1 child in [place], 2 in [place]. Exercise - treadmill, elliptical.  [employer].  Divorced, no significant other, lives with grand-daughter.  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 04/2017    Family History  Problem Relation Age of Onset  . Diabetes Mother   . Hyperlipidemia Mother   . Eclampsia Mother   . Seizures Mother        died of seizure  . Asthma Sister   . Hypertension Sister   . Diabetes Sister   . Cancer Maternal Grandmother   . Hypertension Maternal Grandmother   . Hypertension Father   . Gout Father   . Hypertension Maternal Grandfather   . Hypertension Paternal Grandmother   . Heart disease Neg Hx      Current Outpatient Prescriptions:  .  calcium citrate (CALCITRATE -  DOSED IN MG ELEMENTAL CALCIUM) 950 MG tablet, Take 1 tablet by mouth daily., Disp: , Rfl:  .  diphenhydramine-acetaminophen (TYLENOL PM) 25-500 MG TABS, Take 1 tablet by mouth at bedtime as needed. Reported on 03/14/2016, Disp: , Rfl:  .  Multiple Vitamins-Minerals (MULTIVITAMIN WITH MINERALS) tablet, Take 1 tablet by mouth daily.  , Disp: , Rfl:   Allergies  Allergen Reactions  . Aspirin Hives    hives  . Shellfish Allergy Hives    Review of Systems Constitutional: -fever, -chills, -sweats, -unexpected weight change, -decreased appetite, -fatigue Allergy: -sneezing, -itching, -congestion Dermatology: -changing moles, --rash, -lumps ENT: -runny nose, -ear pain, -sore throat, -hoarseness, -sinus pain, -teeth pain, - ringing in ears, -hearing loss, -nosebleeds Cardiology: -chest pain, -palpitations, -swelling, -difficulty breathing when lying flat,  -waking up short of breath Respiratory: -cough, -shortness of breath, -difficulty breathing with exercise or exertion, -wheezing, -coughing up blood Gastroenterology: -abdominal pain, -nausea, -vomiting, -diarrhea, -constipation, -blood in stool, -changes in bowel movement, -difficulty swallowing or eating Hematology: -bleeding, -bruising  Musculoskeletal: +joint aches, -muscle aches, -joint swelling, -back pain, -neck pain, -cramping, -changes in gait Ophthalmology: denies vision changes, eye redness, itching, discharge Urology: -burning with urination, -difficulty urinating, -blood in urine, -urinary frequency, -urgency, -incontinence Neurology: -headache, -weakness, -tingling, -numbness, -memory loss, -falls, -dizziness Psychology: -depressed mood, -agitation, -sleep problems     Objective:   Physical Exam  BP 124/82   Pulse 64   Ht 5\' 4"  (1.626 m)   Wt 197 lb (89.4 kg)   BMI 33.81 kg/m   Wt Readings from Last 3 Encounters:  05/08/17 197 lb (89.4 kg)  03/14/16 195 lb (88.5 kg)  06/06/15 196 lb (88.9 kg)    General appearance: alert, no distress, WD/WN, pleasant AA female Skin: few scattered macules, no worrisome lesions HEENT: normocephalic, conjunctiva/corneas normal, sclerae anicteric, PERRLA, EOMi, nares patent, no discharge or erythema, pharynx normal Oral cavity: MMM, tongue normal, teeth - upper denture, good repair Neck: supple, no lymphadenopathy, no thyromegaly, no masses, normal ROM, no bruits Chest: non tender, normal shape and expansion Heart: RRR, normal S1, S2, no murmurs Lungs: CTA bilaterally, no wheezes, rhonchi, or rales Abdomen: +bs, soft, few surgical port scars, non tender, non distended, no masses, no hepatomegaly, no splenomegaly, no bruits Back: non tender, normal ROM, no scoliosis Musculoskeletal: upper extremities non tender, no obvious deformity, normal ROM throughout, lower extremities non tender, no obvious deformity, normal ROM  throughout Extremities: spider veins and mild varicosities of bilat proximal lower legs, no edema, no cyanosis, no clubbing Pulses: 2+ symmetric, upper and lower extremities, normal cap refill Neurological: alert, oriented x 3, CN2-12 intact, strength normal upper extremities and lower extremities, sensation normal throughout, DTRs 2+ throughout, no cerebellar signs, gait normal Psychiatric: normal affect, behavior normal, pleasant  Breast/gyn/rectal - deferred to gynecology    Assessment and Plan :    Encounter Diagnoses  Name Primary?  . Routine general medical examination at a health care facility Yes  . Class 1 obesity without serious comorbidity in adult, unspecified BMI, unspecified obesity type   . History of uterine fibroid   . Vaccine counseling    Physical exam - discussed healthy lifestyle, diet, exercise, preventative care, vaccinations, and addressed their concerns.  Handout given. Advised routine eye doctor and dentist visits, colonoscopy q10 years, routine yearly f/u with gynecology.  Obesity - advised weight loss goals.  She is exercising and eating healthy She is up to date on Tdap. Advised yearly flu shot Advised daily vit d  and calcium supplement Check insurance coverage for shingles vaccine Consider bone density screen given hx/o fracture, estrogen defiency Follow-up pending labs  Shunta was seen today for annual exam.  Diagnoses and all orders for this visit:  Routine general medical examination at a health care facility -     Urinalysis Dipstick -     Hemoglobin A1c -     Comprehensive metabolic panel -     VITAMIN D 25 Hydroxy (Vit-D Deficiency, Fractures)  Class 1 obesity without serious comorbidity in adult, unspecified BMI, unspecified obesity type  History of uterine fibroid  Vaccine counseling

## 2017-05-09 LAB — HEMOGLOBIN A1C
Hgb A1c MFr Bld: 5.4 % (ref <5.7)
MEAN PLASMA GLUCOSE: 108 mg/dL

## 2017-08-19 ENCOUNTER — Ambulatory Visit (INDEPENDENT_AMBULATORY_CARE_PROVIDER_SITE_OTHER): Payer: BC Managed Care – PPO | Admitting: Medical

## 2017-08-19 ENCOUNTER — Encounter: Payer: Self-pay | Admitting: Medical

## 2017-08-19 VITALS — BP 114/82 | HR 67 | Temp 97.9°F | Wt 198.0 lb

## 2017-08-19 DIAGNOSIS — H68011 Acute Eustachian salpingitis, right ear: Secondary | ICD-10-CM | POA: Diagnosis not present

## 2017-08-19 NOTE — Patient Instructions (Signed)
Barotitis Media Barotitis media is soreness (inflammation) of the area behind the eardrum (middle ear). This occurs when the auditory tube (Eustachian tube) leading from the back of the throat to the eardrum is blocked. When it is blocked air cannot move in and out of the middle ear to equalize pressure changes. These pressure changes come from changes in altitude when:  Flying.   Driving in the mountains.   Diving.  Problems are more likely to occur with pressure changes during times when you are congested as from:  Hay fever.   Upper respiratory infection.   A cold.  Damage or hearing loss (barotrauma) caused by this may be permanent. HOME CARE INSTRUCTIONS   Use medicines as recommended by your caregiver. Over the counter medicines will help unblock the canal and can help during times of air travel.   Do not put anything into your ears to clean or unplug them. Eardrops will not be helpful.   Do not swim, dive, or fly until your caregiver says it is all right to do so. If these activities are necessary, chewing gum with frequent swallowing may help. It is also helpful to hold your nose and gently blow to pop your ears for equalizing pressure changes. This forces air into the Eustachian tube.   For little ones with problems, give your baby a bottle of water or juice during periods when pressure changes would be anticipated such as during take offs and landings associated with air travel.   Only take over-the-counter or prescription medicines for pain, discomfort, or fever as directed by your caregiver.   A decongestant may be helpful in de-congesting the middle ear and make pressure equalization easier. This can be even more effective if the drops (spray) are delivered with the head lying over the edge of a bed with the head tilted toward the ear on the affected side.   If your caregiver has given you a follow-up appointment, it is very important to keep that appointment. Not keeping  the appointment could result in a chronic or permanent injury, pain, hearing loss and disability. If there is any problem keeping the appointment, you must call back to this facility for assistance.  SEEK IMMEDIATE MEDICAL CARE IF:   You develop a severe headache, dizziness, severe ear pain, or bloody or pus-like drainage from your ears.   An oral temperature above 102 F (38.9 C) develops.   Your problems do not improve or become worse.  MAKE SURE YOU:   Understand these instructions.   Will watch your condition.   Will get help right away if you are not doing well or get worse.  Document Released: 11/22/2000 Document Revised: 08/07/2011 Document Reviewed: 06/30/2008 ExitCare Patient Information 2012 [employer]. 

## 2017-08-19 NOTE — Progress Notes (Signed)
Subjective: Chief Complaint  Patient presents with  . Ear Pain    rt ear pain , started about 3 days ago    Here for 3 day hx/o having some right ear pain and pressure, awoke her 2am.   Felt like it was draining.  Also has mucous she hocks up in the morning.   No sore throat, no head congestion, no headaches, no fever, no NVD.   Using nothing for symptoms.     She has been doing water aerobics but doesn't submerge head in water.  No other aggravating or relieving factors. No other complaint.   Past Medical History:  Diagnosis Date  . Allergy   . Anemia   . History of uterine fibroid   . Immune to hepatitis B 03/20/2001  . Immune to mumps 01/17/2006  . Immune to rubella 12/10/1983  . Obesity   . Wears dentures    partial  . Wears glasses    Current Outpatient Prescriptions on File Prior to Visit  Medication Sig Dispense Refill  . calcium citrate (CALCITRATE - DOSED IN MG ELEMENTAL CALCIUM) 950 MG tablet Take 1 tablet by mouth daily.    . diphenhydramine-acetaminophen (TYLENOL PM) 25-500 MG TABS Take 1 tablet by mouth at bedtime as needed. Reported on 03/14/2016    . Multiple Vitamins-Minerals (MULTIVITAMIN WITH MINERALS) tablet Take 1 tablet by mouth daily.       No current facility-administered medications on file prior to visit.    ROS as in subjective   Objective: BP 114/82   Pulse 67   Temp 97.9 F (36.6 C)   Wt 198 lb (89.8 kg)   SpO2 96%   BMI 33.99 kg/m   General appearance: Alert, WD/WN, no distress                             Skin: warm, no rash                           Head: no sinus tenderness                            Eyes: conjunctiva normal, corneas clear, PERRLA                            Ears: TMs pearly, ear canals normal, external ear canals normal                          Nose: septum midline, turbinates swollen, with erythema and clear discharge             Mouth/throat: MMM, tongue normal, mild pharyngeal erythema with post nasal drainage                   Neck: supple, no adenopathy, no thyromegaly, non tender                         Lungs: CTA bilaterally, no wheezes, rales, or rhonchi      Assessment: Encounter Diagnosis  Name Primary?  . Acute salpingitis of right eustachian tube Yes     Plan: discussed symptoms, diagnosis.  Begin either Flonase or Sudafed OTC, rest, hydrate well, and discussed supportive care.    If worse or not improving by Friday, let me  know.  Edit was seen today for ear pain.  Diagnoses and all orders for this visit:  Acute salpingitis of right eustachian tube

## 2017-12-11 ENCOUNTER — Encounter: Payer: Self-pay | Admitting: Medical

## 2017-12-16 ENCOUNTER — Ambulatory Visit: Payer: BC Managed Care – PPO | Admitting: Medical

## 2017-12-16 ENCOUNTER — Encounter: Payer: Self-pay | Admitting: Medical

## 2017-12-16 VITALS — BP 128/68 | HR 72 | Resp 16 | Ht 64.5 in

## 2017-12-16 DIAGNOSIS — F411 Generalized anxiety disorder: Secondary | ICD-10-CM

## 2017-12-16 DIAGNOSIS — F418 Other specified anxiety disorders: Secondary | ICD-10-CM | POA: Diagnosis not present

## 2017-12-16 NOTE — Progress Notes (Signed)
Subjective: Chief Complaint  Patient presents with  . Anxiety    needs more time taking exams for certification    Here for concerns about test taking anxiety.  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   She is requesting letter stating she needs additional time and larger font.   She reports history of test taking anxiety in recent and remote past.   She notes getting her masters degree in 2015 and had to take that licensing tests multiple times then as well.  Failed first few tries.   She notes on tests gets really anxious, feels overwhelmed by the material and with the significance of the testing.  Although she feels like she knows the material, just takes her longer to process through the case studies and wording.  She notes she is always had problems on test taking even in undergrad in high school.  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  She feels like she could benefit from extended time and larger font.  The test is a 3-hour test, 150 multiple-choice questions that she will need to take.  Compared to her peers she is an older student and thinks this also plays into her having to take more time to complete the test.  In general she is compulsively get things done is not a procrastinator.  She plans to retake this test within the next 30 days  She currently has a Oceanographer in Nutritional therapist.  1 of her goals has been to get the certification for Clinical addiction for substance program.  Been in this 2 year program.    She would like a letter emailed to Valora Corporal with [place] professional practice board.  Past Medical History:  Diagnosis Date  . Allergy   . Anemia   . History of uterine fibroid   . Immune to hepatitis B 03/20/2001  . Immune to mumps 01/17/2006  . Immune to rubella 12/10/1983  . Obesity   . Wears dentures    partial  .  Wears glasses    Current Outpatient Medications on File Prior to Visit  Medication Sig Dispense Refill  . calcium citrate (CALCITRATE - DOSED IN MG ELEMENTAL CALCIUM) 950 MG tablet Take 1 tablet by mouth daily.    . diphenhydramine-acetaminophen (TYLENOL PM) 25-500 MG TABS Take 1 tablet by mouth at bedtime as needed. Reported on 03/14/2016    . Multiple Vitamins-Minerals (MULTIVITAMIN WITH MINERALS) tablet Take 1 tablet by mouth daily.       No current facility-administered medications on file prior to visit.    ROS as in subjective   Objective: BP 128/68   Pulse 72   Resp 16   Ht 5' 4.5" (1.638 m)   SpO2 98%   BMI 33.46 kg/m   Gen: wd, wn, nad Psych: pleasant, good eye contact, answers questions appropriately   Assessment: Encounter Diagnoses  Name Primary?  . Test anxiety Yes  . Anxiety state      Plan: We discussed her concerns, her past issues and difficulties on collegiate testing, standardized testing.  I think is reasonable to write a letter requesting she be allowed to have extended time and if possibly having the testing moderate providing a larger font to help her complete her assignments.  She feels that this is a major barrier although she is knows the material and is passionate about her career and this  goal for her.  I will write her a letter and submit it at her request.  Jin was seen today for anxiety.  Diagnoses and all orders for this visit:  Test anxiety  Anxiety state

## 2017-12-17 ENCOUNTER — Encounter: Payer: Self-pay | Admitting: Medical

## 2018-04-07 LAB — HM MAMMOGRAPHY

## 2018-04-30 ENCOUNTER — Ambulatory Visit: Payer: BC Managed Care – PPO | Admitting: Medical

## 2018-04-30 ENCOUNTER — Encounter: Payer: Self-pay | Admitting: Medical

## 2018-04-30 VITALS — BP 130/80 | HR 70 | Temp 98.0°F | Ht 64.0 in | Wt 195.8 lb

## 2018-04-30 DIAGNOSIS — R05 Cough: Secondary | ICD-10-CM | POA: Diagnosis not present

## 2018-04-30 DIAGNOSIS — R059 Cough, unspecified: Secondary | ICD-10-CM

## 2018-04-30 DIAGNOSIS — R062 Wheezing: Secondary | ICD-10-CM | POA: Diagnosis not present

## 2018-04-30 DIAGNOSIS — J301 Allergic rhinitis due to pollen: Secondary | ICD-10-CM | POA: Insufficient documentation

## 2018-04-30 MED ORDER — ALBUTEROL SULFATE 108 (90 BASE) MCG/ACT IN AEPB: 2.0000 | INHALATION_SPRAY | RESPIRATORY_TRACT | 1 refills | Status: DC | PRN

## 2018-04-30 MED ORDER — FLUTICASONE FUROATE-VILANTEROL 100-25 MCG/INH IN AEPB: 1.0000 | INHALATION_SPRAY | Freq: Every day | RESPIRATORY_TRACT | 2 refills | Status: DC

## 2018-04-30 MED ORDER — CETIRIZINE HCL 10 MG PO TABS: 10.0000 mg | ORAL_TABLET | Freq: Every day | ORAL | 2 refills | Status: DC

## 2018-04-30 MED ORDER — ALBUTEROL SULFATE (2.5 MG/3ML) 0.083% IN NEBU
2.5000 mg | INHALATION_SOLUTION | Freq: Once | RESPIRATORY_TRACT | Status: AC
  Administered 2018-04-30: 2.5 mg via RESPIRATORY_TRACT

## 2018-04-30 NOTE — Patient Instructions (Signed)
Your symptoms and exam findings today suggest allergic asthma  Recommendations:  Begin Breo preventative inhaler 1 puff once daily in the morning  Rinse your mouth out the water about 5 minutes later and swallow  Begin cetirizine/Zyrtec 10 mg daily at bedtime allergy pill for the next several weeks  I also sent a medication called albuterol.  This is a rescue emergency inhaler you can use 2 puffs every 4-6 hours for shortness of breath or wheezing or cough spells  If you do not feel much improved by Monday then call or recheck  If you are seeing improvements then you can try stopping these medicines in about 2 weeks to see how you do  Given the allergy asthma picture you will likely need to do the similar regimen in spring and fall  Thus I recommend possibly restarting the same regimen in mid August or late August before the fall season starts     Asthma, Adult Asthma is a condition of the lungs in which the airways tighten and narrow. Asthma can make it hard to breathe. Asthma cannot be cured, but medicine and lifestyle changes can help control it. Asthma may be started (triggered) by:  Animal skin flakes (dander).  Dust.  Cockroaches.  Pollen.  Mold.  Smoke.  Cleaning products.  Hair sprays or aerosol sprays.  Paint fumes or strong smells.  Cold air, weather changes, and winds.  Crying or laughing hard.  Stress.  Certain medicines or drugs.  Foods, such as dried fruit, potato chips, and sparkling grape juice.  Infections or conditions (colds, flu).  Exercise.  Certain medical conditions or diseases.  Exercise or tiring activities.  Follow these instructions at home:  Take medicine as told by your doctor.  Use a peak flow meter as told by your doctor. A peak flow meter is a tool that measures how well the lungs are working.  Record and keep track of the peak flow meter's readings.  Understand and use the asthma action plan. An asthma action plan  is a written plan for taking care of your asthma and treating your attacks.  To help prevent asthma attacks: ? Do not smoke. Stay away from secondhand smoke. ? Change your heating and air conditioning filter often. ? Limit your use of fireplaces and wood stoves. ? Get rid of pests (such as roaches and mice) and their droppings. ? Throw away plants if you see mold on them. ? Clean your floors. Dust regularly. Use cleaning products that do not smell. ? Have someone vacuum when you are not home. Use a vacuum cleaner with a HEPA filter if possible. ? Replace carpet with wood, tile, or vinyl flooring. Carpet can trap animal skin flakes and dust. ? Use allergy-proof pillows, mattress covers, and box spring covers. ? Wash bed sheets and blankets every week in hot water and dry them in a dryer. ? Use blankets that are made of polyester or cotton. ? Clean bathrooms and kitchens with bleach. If possible, have someone repaint the walls in these rooms with mold-resistant paint. Keep out of the rooms that are being cleaned and painted. ? Wash hands often. Contact a doctor if:  You have make a whistling sound when breaking (wheeze), have shortness of breath, or have a cough even if taking medicine to prevent attacks.  The colored mucus you cough up (sputum) is thicker than usual.  The colored mucus you cough up changes from clear or white to yellow, green, gray, or bloody.  You  have problems from the medicine you are taking such as: ? A rash. ? Itching. ? Swelling. ? Trouble breathing.  You need reliever medicines more than 2-3 times a week.  Your peak flow measurement is still at 50-79% of your personal best after following the action plan for 1 hour.  You have a fever. Get help right away if:  You seem to be worse and are not responding to medicine during an asthma attack.  You are short of breath even at rest.  You get short of breath when doing very little activity.  You have trouble  eating, drinking, or talking.  You have chest pain.  You have a fast heartbeat.  Your lips or fingernails start to turn blue.  You are light-headed, dizzy, or faint.  Your peak flow is less than 50% of your personal best. This information is not intended to replace advice given to you by your health care provider. Make sure you discuss any questions you have with your health care provider. Document Released: 05/13/2008 Document Revised: 05/02/2016 Document Reviewed: 06/24/2013 Elsevier Interactive Patient Education  2017 [employer].

## 2018-04-30 NOTE — Progress Notes (Signed)
Subjective Chief Complaint  Patient presents with  . Cough    dry ongoing x1 month    Here for cough.  She attributed this to allergies.   Every time she takes a deep breath gets cough.   Been using mucinex, but this isn't helping. Hasn't taken allergy pill.  Did have some runny nose and sneezing a month ago.  But took Childrens Claritin initially.  Since then having cough.  No itchy or watery eyes.  Gets runny nose some now.  Feels like cough fits.  No NVD, no fever, no hemoptysis, no night sweats.   No sick contacts at home but does have work sick contacts.  No hx/o asthma, but has been prescribed inhaler in the past once.  Hears a sound or whistle in chest.   No other aggravating or relieving factors. No other complaint.  Past Medical History:  Diagnosis Date  . Allergy   . Anemia   . History of uterine fibroid   . Immune to hepatitis B 03/20/2001  . Immune to mumps 01/17/2006  . Immune to rubella 12/10/1983  . Obesity   . Wears dentures    partial  . Wears glasses    Current Outpatient Medications on File Prior to Visit  Medication Sig Dispense Refill  . diphenhydramine-acetaminophen (TYLENOL PM) 25-500 MG TABS Take 1 tablet by mouth at bedtime as needed. Reported on 03/14/2016    . Multiple Vitamins-Minerals (MULTIVITAMIN WITH MINERALS) tablet Take 1 tablet by mouth daily.       No current facility-administered medications on file prior to visit.    ROS as in subjective  Objective: BP 130/80   Pulse 70   Temp 98 F (36.7 C) (Oral)   Ht 5\' 4"  (1.626 m)   Wt 195 lb 12.8 oz (88.8 kg)   SpO2 97%   BMI 33.61 kg/m   General appearance: Alert, WD/WN, no distress, coughing                             Skin: warm, no rash, no diaphoresis                           Head: no sinus tenderness                            Eyes: conjunctiva normal, corneas clear, PERRLA                            Ears: pearly TMs, external ear canals normal                          Nose: septum  midline, turbinates swollen, without erythema,+ clear discharge             Mouth/throat: MMM, tongue normal, no pharyngeal erythema                           Neck: supple, no adenopathy, no thyromegaly, non tender                          Heart: RRR, normal S1, S2, no murmurs  Lungs: +bronchial breath sounds, - rhonchi, +expiratory wheezes, no rales                Extremities: no edema, non tender No calve tenderness or swelling, -homans        Assessment: Encounter Diagnoses  Name Primary?  . Cough Yes  . Wheezing   . Allergic rhinitis due to pollen, unspecified seasonality       Plan: PFT reviewed.  Albuterol neb treatment given  Discussed symptoms and exam  Your symptoms and exam findings today suggest allergic asthma  Recommendations:  Begin Breo preventative inhaler 1 puff once daily in the morning  Rinse your mouth out the water about 5 minutes later and swallow  Begin cetirizine/Zyrtec 10 mg daily at bedtime allergy pill for the next several weeks  I also sent a medication called albuterol.  This is a rescue emergency inhaler you can use 2 puffs every 4-6 hours for shortness of breath or wheezing or cough spells  If you do not feel much improved by Monday then call or recheck  If you are seeing improvements then you can try stopping these medicines in about 2 weeks to see how you do  Given the allergy asthma picture you will likely need to do the similar regimen in spring and fall  Thus I recommend possibly restarting the same regimen in mid August or late August before the fall season starts     Laporche was seen today for cough.  Diagnoses and all orders for this visit:  Cough  Wheezing  Allergic rhinitis due to pollen, unspecified seasonality

## 2018-08-25 ENCOUNTER — Encounter: Payer: Self-pay | Admitting: Medical

## 2018-08-25 ENCOUNTER — Ambulatory Visit: Payer: BC Managed Care – PPO | Admitting: Medical

## 2018-08-25 VITALS — BP 130/80 | HR 69 | Temp 98.0°F | Resp 16 | Ht 64.0 in | Wt 193.6 lb

## 2018-08-25 DIAGNOSIS — E569 Vitamin deficiency, unspecified: Secondary | ICD-10-CM

## 2018-08-25 DIAGNOSIS — J301 Allergic rhinitis due to pollen: Secondary | ICD-10-CM | POA: Diagnosis not present

## 2018-08-25 DIAGNOSIS — Z7185 Encounter for immunization safety counseling: Secondary | ICD-10-CM

## 2018-08-25 DIAGNOSIS — E669 Obesity, unspecified: Secondary | ICD-10-CM

## 2018-08-25 DIAGNOSIS — Z7189 Other specified counseling: Secondary | ICD-10-CM | POA: Diagnosis not present

## 2018-08-25 DIAGNOSIS — Z0000 Encounter for general adult medical examination without abnormal findings: Secondary | ICD-10-CM | POA: Diagnosis not present

## 2018-08-25 DIAGNOSIS — R251 Tremor, unspecified: Secondary | ICD-10-CM

## 2018-08-25 DIAGNOSIS — Z1211 Encounter for screening for malignant neoplasm of colon: Secondary | ICD-10-CM

## 2018-08-25 LAB — POCT URINALYSIS DIP (PROADVANTAGE DEVICE)
BILIRUBIN UA: NEGATIVE
BILIRUBIN UA: NEGATIVE mg/dL
Glucose, UA: NEGATIVE mg/dL
Leukocytes, UA: NEGATIVE
Nitrite, UA: NEGATIVE
PH UA: 7 (ref 5.0–8.0)
Protein Ur, POC: NEGATIVE mg/dL
RBC UA: NEGATIVE
Specific Gravity, Urine: 1.015
Urobilinogen, Ur: NEGATIVE

## 2018-08-25 NOTE — Patient Instructions (Signed)
Recommendations: Shingles vaccine:  I recommend you have a shingles vaccine to help prevent shingles or herpes zoster outbreak.   Please call your insurer to inquire about coverage for the Shingrix vaccine given in 2 doses.   Some insurers cover this vaccine after age 59, some cover this after age 58.  If your insurer covers this, then call to schedule appointment to have this vaccine here.  Return stool cards for blood testing  Get your flu shot at [facility]   Thanks for trusting [place] with your health care and for coming in for a physical today.  Below are some general recommendations I have for you:  Yearly screenings See your eye doctor yearly for routine vision care. See your dentist yearly for routine dental care including hygiene visits twice yearly. See me here yearly for a routine physical and preventative care visit   Cancer screening  Please follow up yearly for a physical.    I have included other useful information below for your review.  Preventative Care for Adults - Female      MAINTAIN REGULAR HEALTH EXAMS:  A routine yearly physical is a good way to check in with your primary care provider about your health and preventive screening. It is also an opportunity to share updates about your health and any concerns you have, and receive a thorough all-over exam.   Most health insurance companies pay for at least some preventative services.  Check with your health plan for specific coverages.  WHAT PREVENTATIVE SERVICES DO WOMEN NEED?  Adult women should have their weight and blood pressure checked regularly.   Women age 52 and older should have their cholesterol levels checked regularly.  Women should be screened for cervical cancer with a Pap smear and pelvic exam beginning at either age 41, or 3 years after they become sexually activity.    Breast cancer screening generally begins at age 41 with a mammogram and breast exam by your primary care provider.     Beginning at age 48 and continuing to age 52, women should be screened for colorectal cancer.  Certain people may need continued testing until age 35.  Updating vaccinations is part of preventative care.  Vaccinations help protect against diseases such as the flu.  Osteoporosis is a disease in which the bones lose minerals and strength as we age. Women ages 41 and over should discuss this with their caregivers, as should women after menopause who have other risk factors.  Lab tests are generally done as part of preventative care to screen for anemia and blood disorders, to screen for problems with the kidneys and liver, to screen for bladder problems, to check blood sugar, and to check your cholesterol level.  Preventative services generally include counseling about diet, exercise, avoiding tobacco, drugs, excessive alcohol consumption, and sexually transmitted infections.    GENERAL RECOMMENDATIONS FOR GOOD HEALTH:  Healthy diet:  Eat a variety of foods, including fruit, vegetables, animal or vegetable protein, such as meat, fish, chicken, and eggs, or beans, lentils, tofu, and grains, such as rice.  Drink plenty of water daily.  Decrease saturated fat in the diet, avoid lots of red meat, processed foods, sweets, fast foods, and fried foods.  Exercise:  Aerobic exercise helps maintain good heart health. At least 30-40 minutes of moderate-intensity exercise is recommended. For example, a brisk walk that increases your heart rate and breathing. This should be done on most days of the week.   Find a type of exercise  or a variety of exercises that you enjoy so that it becomes a part of your daily life.  Examples are running, walking, swimming, water aerobics, and biking.  For motivation and support, explore group exercise such as aerobic class, spin class, Zumba, Yoga,or  martial arts, etc.    Set exercise goals for yourself, such as a certain weight goal, walk or run in a race such as a  5k walk/run.  Speak to your primary care provider about exercise goals.  Disease prevention:  If you smoke or chew tobacco, find out from your caregiver how to quit. It can literally save your life, no matter how long you have been a tobacco user. If you do not use tobacco, never begin.   Maintain a healthy diet and normal weight. Increased weight leads to problems with blood pressure and diabetes.   The Body Mass Index or BMI is a way of measuring how much of your body is fat. Having a BMI above 27 increases the risk of heart disease, diabetes, hypertension, stroke and other problems related to obesity. Your caregiver can help determine your BMI and based on it develop an exercise and dietary program to help you achieve or maintain this important measurement at a healthful level.  High blood pressure causes heart and blood vessel problems.  Persistent high blood pressure should be treated with medicine if weight loss and exercise do not work.   Fat and cholesterol leaves deposits in your arteries that can block them. This causes heart disease and vessel disease elsewhere in your body.  If your cholesterol is found to be high, or if you have heart disease or certain other medical conditions, then you may need to have your cholesterol monitored frequently and be treated with medication.   Ask if you should have a cardiac stress test if your history suggests this. A stress test is a test done on a treadmill that looks for heart disease. This test can find disease prior to there being a problem.  Menopause can be associated with physical symptoms and risks. Hormone replacement therapy is available to decrease these. You should talk to your caregiver about whether starting or continuing to take hormones is right for you.   Osteoporosis is a disease in which the bones lose minerals and strength as we age. This can result in serious bone fractures. Risk of osteoporosis can be identified using a bone  density scan. Women ages 19 and over should discuss this with their caregivers, as should women after menopause who have other risk factors. Ask your caregiver whether you should be taking a calcium supplement and Vitamin D, to reduce the rate of osteoporosis.   Avoid drinking alcohol in excess (more than two drinks per day).  Avoid use of street drugs. Do not share needles with anyone. Ask for professional help if you need assistance or instructions on stopping the use of alcohol, cigarettes, and/or drugs.  Brush your teeth twice a day with fluoride toothpaste, and floss once a day. Good oral hygiene prevents tooth decay and gum disease. The problems can be painful, unattractive, and can cause other health problems. Visit your dentist for a routine oral and dental check up and preventive care every 6-12 months.   Look at your skin regularly.  Use a mirror to look at your back. Notify your caregivers of changes in moles, especially if there are changes in shapes, colors, a size larger than a pencil eraser, an irregular border, or development of new  moles.  Safety:  Use seatbelts 100% of the time, whether driving or as a passenger.  Use safety devices such as hearing protection if you work in environments with loud noise or significant background noise.  Use safety glasses when doing any work that could send debris in to the eyes.  Use a helmet if you ride a bike or motorcycle.  Use appropriate safety gear for contact sports.  Talk to your caregiver about gun safety.  Use sunscreen with a SPF (or skin protection factor) of 15 or greater.  Lighter skinned people are at a greater risk of skin cancer. Don't forget to also wear sunglasses in order to protect your eyes from too much damaging sunlight. Damaging sunlight can accelerate cataract formation.   Practice safe sex. Use condoms. Condoms are used for birth control and to help reduce the spread of sexually transmitted infections (or STIs).  Some of the  STIs are gonorrhea (the clap), chlamydia, syphilis, trichomonas, herpes, HPV (human papilloma virus) and HIV (human immunodeficiency virus) which causes AIDS. The herpes, HIV and HPV are viral illnesses that have no cure. These can result in disability, cancer and death.   Keep carbon monoxide and smoke detectors in your home functioning at all times. Change the batteries every 6 months or use a model that plugs into the wall.   Vaccinations:  Stay up to date with your tetanus shots and other required immunizations. You should have a booster for tetanus every 10 years. Be sure to get your flu shot every year, since 5%-20% of the [place] population comes down with the flu. The flu vaccine changes each year, so being vaccinated once is not enough. Get your shot in the fall, before the flu season peaks.   Other vaccines to consider:  Human Papilloma Virus or HPV causes cancer of the cervix, and other infections that can be transmitted from person to person. There is a vaccine for HPV, and females should get immunized between the ages of 58 and 11. It requires a series of 3 shots.   Pneumococcal vaccine to protect against certain types of pneumonia.  This is normally recommended for adults age 21 or older.  However, adults younger than 59 years old with certain underlying conditions such as diabetes, heart or lung disease should also receive the vaccine.  Shingles vaccine to protect against Varicella Zoster if you are older than age 12, or younger than 59 years old with certain underlying illness.  If you have not had the Shingrix vaccine, please call your insurer to inquire about coverage for the Shingrix vaccine given in 2 doses.   Some insurers cover this vaccine after age 14, some cover this after age 25.  If your insurer covers this, then call to schedule appointment to have this vaccine here  Hepatitis A vaccine to protect against a form of infection of the liver by a virus acquired from  food.  Hepatitis B vaccine to protect against a form of infection of the liver by a virus acquired from blood or body fluids, particularly if you work in health care.  If you plan to travel internationally, check with your local health department for specific vaccination recommendations.  Cancer Screening:  Breast cancer screening is essential to preventive care for women. All women age 79 and older should perform a breast self-exam every month. At age 53 and older, women should have their caregiver complete a breast exam each year. Women at ages 65 and older should have a  mammogram (x-ray film) of the breasts. Your caregiver can discuss how often you need mammograms.    Cervical cancer screening includes taking a Pap smear (sample of cells examined under a microscope) from the cervix (end of the uterus). It also includes testing for HPV (Human Papilloma Virus, which can cause cervical cancer). Screening and a pelvic exam should begin at age 34, or 3 years after a woman becomes sexually active. Screening should occur every year, with a Pap smear but no HPV testing, up to age 53. After age 66, you should have a Pap smear every 3 years with HPV testing, if no HPV was found previously.   Most routine colon cancer screening begins at the age of 59. On a yearly basis, doctors may provide special easy to use take-home tests to check for hidden blood in the stool. Sigmoidoscopy or colonoscopy can detect the earliest forms of colon cancer and is life saving. These tests use a small camera at the end of a tube to directly examine the colon. Speak to your caregiver about this at age 70, when routine screening begins (and is repeated every 5 years unless early forms of pre-cancerous polyps or small growths are found).

## 2018-08-25 NOTE — Progress Notes (Signed)
Subjective:   HPI  [name] is a 57 y.o. female who presents for a complete physical.  Medical care team includes:  Dr. Fayne Mediate, gynecology  Dorothea Ogle, PA-C   Dr. Archie Balboa, dentist  Dr. Delman Cheadle, ophthalmology  ortho  Concerns: Only concern is a friend mentioned she had shakes or tremor they noticed, but she doesn't report this.  She denies ataxia, confusion, memory change, weakness, or paresthesias.     Up to date on Mammo and pap through gynecology  Gets her flu shot free at work  Reviewed their medical, surgical, family, social, medication, and allergy history and updated chart as appropriate.  Past Medical History:  Diagnosis Date  . Allergy   . Anemia   . History of uterine fibroid   . Immune to hepatitis B 03/20/2001  . Immune to mumps 01/17/2006  . Immune to rubella 12/10/1983  . Obesity   . Wears dentures    partial  . Wears glasses     Past Surgical History:  Procedure Laterality Date  . COLONOSCOPY  03/2010   hyperplastic polyp, repeat 2021; Dr. Virgel Bouquet  . HYSTEROSCOPY D AND C    . MYOMECTOMY  11/1998  . ROBOT ASSISTED MYOMECTOMY  04/08/2012   Procedure: ROBOTIC ASSISTED MYOMECTOMY;  Surgeon: Alwyn Pea, MD;  Location: [facility];  Service: Gynecology;  Laterality: N/A;  . TONSILECTOMY, ADENOIDECTOMY, BILATERAL MYRINGOTOMY AND TUBES    . TUBAL LIGATION      Social History   Socioeconomic History  . Marital status: Single    Spouse name: Not on file  . Number of children: Not on file  . Years of education: Not on file  . Highest education level: Not on file  Occupational History  . Not on file  Social Needs  . Financial resource strain: Not on file  . Food insecurity:    Worry: Not on file    Inability: Not on file  . Transportation needs:    Medical: Not on file    Non-medical: Not on file  Tobacco Use  . Smoking status: Former Smoker    Packs/day: 0.25    Years: 15.00    Pack years: 3.75    Types: Cigarettes   Last attempt to quit: 12/10/1983    Years since quitting: 34.7  . Smokeless tobacco: Never Used  Substance and Sexual Activity  . Alcohol use: Yes    Alcohol/week: 1.0 standard drinks    Types: 1 Glasses of wine per week    Comment: occasionally  . Drug use: No  . Sexual activity: Not Currently    Partners: Male    Birth control/protection: Other-see comments    Comment: BTL  Lifestyle  . Physical activity:    Days per week: Not on file    Minutes per session: Not on file  . Stress: Not on file  Relationships  . Social connections:    Talks on phone: Not on file    Gets together: Not on file    Attends religious service: Not on file    Active member of club or organization: Not on file    Attends meetings of clubs or organizations: Not on file    Relationship status: Not on file  . Intimate partner violence:    Fear of current or ex partner: Not on file    Emotionally abused: Not on file    Physically abused: Not on file    Forced sexual activity: Not on file  Other  Topics Concern  . Not on file  Social History Narrative   Has 3 children, all adults.   1 child in [place], 2 in [place]. Exercise - treadmill, elliptical.  [employer].  Divorced, no significant other, lives with grand-daughter.  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  08/2018    Family History  Problem Relation Age of Onset  . Diabetes Mother   . Hyperlipidemia Mother   . Eclampsia Mother   . Seizures Mother        died of seizure  . Asthma Sister   . Hypertension Sister   . Diabetes Sister   . Cancer Maternal Grandmother   . Hypertension Maternal Grandmother   . Hypertension Father   . Gout Father   . Hypertension Maternal Grandfather   . Hypertension Paternal Grandmother   . Heart disease Neg Hx      Current Outpatient Medications:  .  Albuterol Sulfate 108 (90 Base) MCG/ACT AEPB, Inhale 2 puffs into the lungs  every 4 (four) hours as needed., Disp: 1 each, Rfl: 1 .  diphenhydramine-acetaminophen (TYLENOL PM) 25-500 MG TABS, Take 1 tablet by mouth at bedtime as needed. Reported on 03/14/2016, Disp: , Rfl:  .  Multiple Vitamins-Minerals (MULTIVITAMIN WITH MINERALS) tablet, Take 1 tablet by mouth daily.  , Disp: , Rfl:  .  cetirizine (ZYRTEC) 10 MG tablet, Take 1 tablet (10 mg total) by mouth at bedtime. (Patient not taking: Reported on 08/25/2018), Disp: 30 tablet, Rfl: 2 .  fluticasone furoate-vilanterol (BREO ELLIPTA) 100-25 MCG/INH AEPB, Inhale 1 puff into the lungs daily. (Patient not taking: Reported on 08/25/2018), Disp: 28 each, Rfl: 2  Allergies  Allergen Reactions  . Aspirin Hives    hives  . Shellfish Allergy Hives    Review of Systems Constitutional: -fever, -chills, -sweats, -unexpected weight change, -decreased appetite, -fatigue Allergy: -sneezing, -itching, -congestion Dermatology: -changing moles, --rash, -lumps ENT: -runny nose, -ear pain, -sore throat, -hoarseness, -sinus pain, -teeth pain, - ringing in ears, -hearing loss, -nosebleeds Cardiology: -chest pain, -palpitations, -swelling, -difficulty breathing when lying flat, -waking up short of breath Respiratory: -cough, -shortness of breath, -difficulty breathing with exercise or exertion, -wheezing, -coughing up blood Gastroenterology: -abdominal pain, -nausea, -vomiting, -diarrhea, -constipation, -blood in stool, -changes in bowel movement, -difficulty swallowing or eating Hematology: -bleeding, -bruising  Musculoskeletal: -joint aches, -muscle aches, -joint swelling, -back pain, -neck pain, -cramping, -changes in gait Ophthalmology: denies vision changes, eye redness, itching, discharge Urology: -burning with urination, -difficulty urinating, -blood in urine, -urinary frequency, -urgency, -incontinence Neurology: -headache, -weakness, -tingling, -numbness, -memory loss, -falls, -dizziness Psychology: -depressed mood, -agitation,  -sleep problems     Objective:   Physical Exam  BP 130/80   Pulse 69   Temp 98 F (36.7 C) (Oral)   Resp 16   Ht 5\' 4"  (1.626 m)   Wt 193 lb 9.6 oz (87.8 kg)   SpO2 100%   BMI 33.23 kg/m   Wt Readings from Last 3 Encounters:  08/25/18 193 lb 9.6 oz (87.8 kg)  04/30/18 195 lb 12.8 oz (88.8 kg)  08/19/17 198 lb (89.8 kg)    General appearance: alert, no distress, WD/WN, pleasant AA female Skin: few scattered macules, no worrisome lesions HEENT: normocephalic, conjunctiva/corneas normal, sclerae anicteric, PERRLA, EOMi, nares patent, no discharge or erythema, pharynx normal Oral cavity: MMM, tongue normal, teeth - upper denture, good repair Neck: supple, no lymphadenopathy, no thyromegaly, no masses, normal ROM,  no bruits Chest: non tender, normal shape and expansion Heart: RRR, normal S1, S2, no murmurs Lungs: CTA bilaterally, no wheezes, rhonchi, or rales Abdomen: +bs, soft, few surgical port scars, non tender, non distended, no masses, no hepatomegaly, no splenomegaly, no bruits Back: non tender, normal ROM, no scoliosis Musculoskeletal: upper extremities non tender, no obvious deformity, normal ROM throughout, lower extremities non tender, no obvious deformity, normal ROM throughout Extremities: spider veins and mild varicosities of bilat proximal lower legs, no edema, no cyanosis, no clubbing Pulses: 2+ symmetric, upper and lower extremities, normal cap refill Neurological: alert, oriented x 3, CN2-12 intact, strength normal upper extremities and lower extremities, sensation normal throughout, DTRs 2+ throughout, no cerebellar signs, gait normal Psychiatric: normal affect, behavior normal, pleasant  Breast/gyn/rectal - deferred to gynecology    Assessment and Plan :    Encounter Diagnoses  Name Primary?  . Routine general medical examination at a health care facility Yes  . Allergic rhinitis due to pollen, unspecified seasonality   . Class 1 obesity without serious  comorbidity in adult, unspecified BMI, unspecified obesity type   . Vaccine counseling   . Screen for colon cancer    Physical exam - discussed healthy lifestyle, diet, exercise, preventative care, vaccinations, and addressed their concerns.  Handout given. Advised routine eye doctor and dentist visits, colonoscopy q10 years, routine yearly f/u with gynecology.  Obesity - advised weight loss goals.  She is exercising and eating healthy She is up to date on Tdap. Advised yearly flu shot Advised daily vit d and calcium supplement Check insurance coverage for shingles vaccine Will request pap and mammogram records through gyn  I advised no obvious tremor on exam.   Discussed symptoms and signs of parkinson's, tremor causes.  Routine labs today  Follow-up pending labs  Meridian was seen today for cpe.  Diagnoses and all orders for this visit:  Routine general medical examination at a health care facility -     POCT Urinalysis DIP (Proadvantage Device) -     Comprehensive metabolic panel -     CBC with Differential/Platelet -     TSH -     Vitamin B12  Allergic rhinitis due to pollen, unspecified seasonality  Class 1 obesity without serious comorbidity in adult, unspecified BMI, unspecified obesity type  Vaccine counseling  Screen for colon cancer

## 2018-08-26 LAB — CBC WITH DIFFERENTIAL/PLATELET
BASOS ABS: 0 10*3/uL (ref 0.0–0.2)
Basos: 0 %
EOS (ABSOLUTE): 0.2 10*3/uL (ref 0.0–0.4)
Eos: 3 %
HEMOGLOBIN: 13 g/dL (ref 11.1–15.9)
Hematocrit: 40.2 % (ref 34.0–46.6)
IMMATURE GRANS (ABS): 0 10*3/uL (ref 0.0–0.1)
IMMATURE GRANULOCYTES: 0 %
LYMPHS: 29 %
Lymphocytes Absolute: 2.2 10*3/uL (ref 0.7–3.1)
MCH: 27.5 pg (ref 26.6–33.0)
MCHC: 32.3 g/dL (ref 31.5–35.7)
MCV: 85 fL (ref 79–97)
MONOCYTES: 8 %
Monocytes Absolute: 0.6 10*3/uL (ref 0.1–0.9)
NEUTROS ABS: 4.6 10*3/uL (ref 1.4–7.0)
Neutrophils: 60 %
Platelets: 224 10*3/uL (ref 150–450)
RBC: 4.72 x10E6/uL (ref 3.77–5.28)
RDW: 14.6 % (ref 12.3–15.4)
WBC: 7.6 10*3/uL (ref 3.4–10.8)

## 2018-08-26 LAB — COMPREHENSIVE METABOLIC PANEL
ALK PHOS: 60 IU/L (ref 39–117)
ALT: 17 IU/L (ref 0–32)
AST: 22 IU/L (ref 0–40)
Albumin/Globulin Ratio: 1.4 (ref 1.2–2.2)
Albumin: 4.1 g/dL (ref 3.5–5.5)
BUN/Creatinine Ratio: 25 — ABNORMAL HIGH (ref 9–23)
BUN: 18 mg/dL (ref 6–24)
Bilirubin Total: 0.4 mg/dL (ref 0.0–1.2)
CALCIUM: 9.7 mg/dL (ref 8.7–10.2)
CO2: 23 mmol/L (ref 20–29)
Chloride: 105 mmol/L (ref 96–106)
Creatinine, Ser: 0.73 mg/dL (ref 0.57–1.00)
GFR calc Af Amer: 104 mL/min/{1.73_m2} (ref >59)
GFR, EST NON AFRICAN AMERICAN: 90 mL/min/{1.73_m2} (ref >59)
GLOBULIN, TOTAL: 3 g/dL (ref 1.5–4.5)
Glucose: 82 mg/dL (ref 65–99)
Potassium: 4.4 mmol/L (ref 3.5–5.2)
SODIUM: 144 mmol/L (ref 134–144)
Total Protein: 7.1 g/dL (ref 6.0–8.5)

## 2018-08-26 LAB — TSH: TSH: 1.79 u[IU]/mL (ref 0.450–4.500)

## 2018-08-26 LAB — VITAMIN B12: VITAMIN B 12: 1501 pg/mL — AB (ref 232–1245)

## 2018-08-27 ENCOUNTER — Telehealth: Payer: Self-pay | Admitting: Medical

## 2018-08-27 NOTE — Telephone Encounter (Signed)
Received requested records from [facility]. Included are mammogram and pap.

## 2018-09-28 ENCOUNTER — Other Ambulatory Visit (INDEPENDENT_AMBULATORY_CARE_PROVIDER_SITE_OTHER): Payer: BC Managed Care – PPO

## 2018-09-28 DIAGNOSIS — Z1211 Encounter for screening for malignant neoplasm of colon: Secondary | ICD-10-CM | POA: Diagnosis not present

## 2018-09-28 LAB — HEMOCCULT GUIAC POC 1CARD (OFFICE)
Card #2 Fecal Occult Blod, POC: NEGATIVE
FECAL OCCULT BLD: NEGATIVE
Fecal Occult Blood, POC: NEGATIVE

## 2018-12-15 ENCOUNTER — Encounter: Payer: Self-pay | Admitting: Medical

## 2018-12-15 ENCOUNTER — Telehealth: Payer: Self-pay | Admitting: Medical

## 2018-12-15 ENCOUNTER — Ambulatory Visit: Payer: BC Managed Care – PPO | Admitting: Medical

## 2018-12-15 VITALS — BP 122/80 | HR 65 | Temp 98.0°F | Resp 16 | Ht 64.5 in | Wt 199.0 lb

## 2018-12-15 DIAGNOSIS — F69 Unspecified disorder of adult personality and behavior: Secondary | ICD-10-CM

## 2018-12-15 DIAGNOSIS — F489 Nonpsychotic mental disorder, unspecified: Secondary | ICD-10-CM

## 2018-12-15 DIAGNOSIS — F418 Other specified anxiety disorders: Secondary | ICD-10-CM | POA: Diagnosis not present

## 2018-12-15 DIAGNOSIS — R4189 Other symptoms and signs involving cognitive functions and awareness: Secondary | ICD-10-CM | POA: Diagnosis not present

## 2018-12-15 DIAGNOSIS — E669 Obesity, unspecified: Secondary | ICD-10-CM | POA: Diagnosis not present

## 2018-12-15 MED ORDER — PHENTERMINE-TOPIRAMATE ER 7.5-46 MG PO CP24: 1.0000 | ORAL_CAPSULE | ORAL | 0 refills | Status: DC

## 2018-12-15 NOTE — Telephone Encounter (Signed)
Please refer to psychologist.  She needs formal evaluation/psychological evaluation.  She takes a long time to process scenarios with learning.  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     I recommend either [facility], or [facility] if they do that kind of evaluation.Marland Kitchen

## 2018-12-15 NOTE — Progress Notes (Signed)
Subjective: Chief Complaint  Patient presents with  . consult    discuss issues taking test   Here for concerns about test taking anxiety.   I saw her 12/2017 for similar.  She notes that she has been stressing out, gaining weight, has trouble processing ideas, takes her longer than other people to read tough material.    Has up to a year to complete her licensure test.  When I saw her a year ago she had recently failed her certification test for Clinical Addiction Substance abuse counseling.   Last year we write a letter on her behalf to ask for extended time for test taking.  However the school did not feel that she warranted extra time without other diagnoses.   She notes getting her masters degree in 2015 and had to take that licensing tests multiple times then as well.  Failed first few tries.   She notes on tests gets really anxious, feels overwhelmed by the material and with the significance of the testing.  Although she feels like she knows the material, just takes her longer to process through the case studies and wording.  She notes she is always had problems on test taking even in undergrad in high school.  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  She feels like she could benefit from extended time and larger font.  The test is a 3-hour test, 150 multiple-choice questions that she will need to take.  Compared to her peers she is an older student and thinks this also plays into her having to take more time to complete the test.  In general she is compulsively get things done is not a procrastinator.  She plans to retake this test within the next 30 days  She currently has a Oceanographer in Nutritional therapist.  1 of her goals has been to get the certification for Clinical addiction for substance program.  Been in this 2 year program.    She requested an appetite suppressant to help her weight loss efforts.  Exercise  Does water  aerobics for exercise 3 times per week, hour each class.  Eating habits: Has been stress eating of late, lots of snacking of late  Past Medical History:  Diagnosis Date  . Allergy   . Anemia   . History of uterine fibroid   . Immune to hepatitis B 03/20/2001  . Immune to mumps 01/17/2006  . Immune to rubella 12/10/1983  . Obesity   . Wears dentures    partial  . Wears glasses    Current Outpatient Medications on File Prior to Visit  Medication Sig Dispense Refill  . Albuterol Sulfate 108 (90 Base) MCG/ACT AEPB Inhale 2 puffs into the lungs every 4 (four) hours as needed. 1 each 1  . diphenhydramine-acetaminophen (TYLENOL PM) 25-500 MG TABS Take 1 tablet by mouth at bedtime as needed. Reported on 03/14/2016    . Multiple Vitamins-Minerals (MULTIVITAMIN WITH MINERALS) tablet Take 1 tablet by mouth daily.      . cetirizine (ZYRTEC) 10 MG tablet Take 1 tablet (10 mg total) by mouth at bedtime. (Patient not taking: Reported on 08/25/2018) 30 tablet 2  . fluticasone furoate-vilanterol (BREO ELLIPTA) 100-25 MCG/INH AEPB Inhale 1 puff into the lungs daily. (Patient not taking: Reported on 08/25/2018) 28 each 2   No current facility-administered medications on file prior to visit.    ROS as in subjective   Objective: BP 122/80  Pulse 65   Temp 98 F (36.7 C) (Oral)   Resp 16   Ht 5' 4.5" (1.638 m)   Wt 199 lb (90.3 kg)   SpO2 97%   BMI 33.63 kg/m    Wt Readings from Last 3 Encounters:  12/15/18 199 lb (90.3 kg)  08/25/18 193 lb 9.6 oz (87.8 kg)  04/30/18 195 lb 12.8 oz (88.8 kg)    Gen: wd, wn, nad Psych: pleasant, good eye contact, answers questions appropriately Heart rrr, normal s1, s2, no murmurs Lungs clear Ext: no edema Pulses WNL    Assessment: Encounter Diagnoses  Name Primary?  . Mental and behavioral problem Yes  . Test anxiety   . Difficulty processing information   . Class 1 obesity without serious comorbidity in adult, unspecified BMI, unspecified  obesity type      Plan: We discussed her concerns, her past issues and difficulties on collegiate testing, standardized testing.  Referral to psychology for formal testing  Obesity - discussed diet, exercise, goal setting, risks/benefits and proper use of medication.    Begin Qsymia, and consider weight watchers she has done in the past.    F/u 85mo   Shaunda was seen today for consult.  Diagnoses and all orders for this visit:  Mental and behavioral problem -     Ambulatory referral to Psychology  Test anxiety -     Ambulatory referral to Psychology  Difficulty processing information -     Ambulatory referral to Psychology  Class 1 obesity without serious comorbidity in adult, unspecified BMI, unspecified obesity type  Other orders -     Phentermine-Topiramate (QSYMIA) 7.5-46 MG CP24; Take 1 capsule by mouth every morning.

## 2018-12-15 NOTE — Patient Instructions (Signed)
Recommendations Begin trial of Qsymia 1 capsule daily in the morning If too expensive, let me know  I recommend you make diet changes as below  I recommend you increase exercise to at least 4-5 days per week if possible    Lets change strategies and try something that may work better than what you are currently doing  I want you to eat 3 meals a day +2 snacks, one midmorning snack and one mid afternoon snack  Breakfast You may eat 1 of the following  Smoothie with Almond milk, handful of kale or spinach, and 1-2 fruit servings of your choice such as berries or 1/2 banana  Whole grain slice of toast and thin layer of low sugar jam or small amount of honey  Whole grain slice of toast and avocado spread  1/2 cup of steel cut oats (oatmeal)   Mid-morning snack 1 fruit serving such as one of the following:  medium-sized apple  medium-sized orange,  Tangerine  1/2 banana   3/4 cup of fresh berries or frozen berries  A protein source such as one of the following:  8 almonds   small handful of walnuts or other nuts  Hummus and vegetable such as carrots   Lunch A protein source such as 1 of the following: . 1 serving of beans such as black beans, pinto beans, green beans, or edamame (soy beans) . Veggie burger  . Non breaded fish such as salmon or tuna, either baked, grilled, or broiled Vegetable - Half of your plate should be a non-starchy vegetables!  So avoid white potatoes and corn.  Otherwise, eat a large portion of vegetables. . Avocado, cucumber, tomato, carrots, greens, lettuce, squash, okra, etc.  . Vegetables can include salad with olive oil/vinaigrette dressing Grains such as 1/2 cup of brown rice, quinoa, barley or other whole grain or 1 or 2 slices of whole grain bread   Mid-afternoon snack 1 fruit serving such as one of the following:  medium-sized apple  medium-sized orange,  Tangerine  1/2 banana   3/4 cup of fresh berries or frozen  berries  A protein source such as one of the following:  8 almonds   small handful of walnuts or other nuts  Hummus and vegetable such as carrots   Dinner A protein source such as 1 of the following: . 1 serving of beans such as black beans, pinto beans, green beans, or edamame (soy beans) . Veggie burger  . Non breaded fish such as salmon or tuna, either baked, grilled, or broiled Vegetable - Half of your plate should be a non-starchy vegetables!  So avoid white potatoes and corn.  Otherwise, eat a large portion of vegetables. . Avocado, cucumber, tomato, carrots, greens, lettuce, squash, okra, etc.  . Vegetables can include salad with olive oil/vinaigrette dressing Grains such as 1/2 cup of brown rice, quinoa, barley or other whole grain or 1 or 2 slices of whole grain bread   Beverages: Water Unsweet tea Home made juice with a juicer without sugar added other than small bit of honey or agave nectar Water with sugar free flavor such as Mio   AVOID.... For the time being I want you to cut out the following items completely: . Soda, sweet tea, juice, beer or wine or alcohol . Sweets such as cake, candy, pies, chips, cookies, chocolate

## 2018-12-16 NOTE — Telephone Encounter (Signed)
Done referral sent

## 2018-12-25 ENCOUNTER — Telehealth: Payer: Self-pay

## 2018-12-25 NOTE — Telephone Encounter (Signed)
Patient called and stated she would like to be referred to [name].

## 2018-12-25 NOTE — Telephone Encounter (Signed)
Patient has been referred to [facility] per her request. Notes sent

## 2019-01-03 ENCOUNTER — Telehealth: Payer: Self-pay | Admitting: Medical

## 2019-01-03 NOTE — Telephone Encounter (Signed)
P.A. denied, pt must try formulary all alternatives Belviq, Belviq XR or Saxenda, do you want to switch?

## 2019-01-03 NOTE — Telephone Encounter (Signed)
P.A. QSYMIA 

## 2019-01-04 NOTE — Telephone Encounter (Signed)
Since insurance wont cover Qsymia, lets try Belviq if agreeable.   It is an oral medication once daily, can help with cravings and has shown benefit as a chronic weight loss medication.     This is used in conjunction with exercise, at least 150 minutes per week as well as making changes in eating habits, avoiding stress eating, consider Weight Watchers program or [place] diet/low carb diet

## 2019-01-07 ENCOUNTER — Other Ambulatory Visit: Payer: Self-pay | Admitting: Medical

## 2019-01-07 MED ORDER — LORCASERIN HCL 10 MG PO TABS: ORAL_TABLET | ORAL | 1 refills | Status: DC

## 2019-01-07 NOTE — Telephone Encounter (Signed)
Med sent as a trial of Belviq, f/u 4-6 wk after starting medication

## 2019-01-07 NOTE — Telephone Encounter (Signed)
Audelia Acton please put in order for Belviq and I will call pt

## 2019-01-08 NOTE — Telephone Encounter (Signed)
Left message for pt

## 2019-02-15 ENCOUNTER — Telehealth: Payer: Self-pay | Admitting: Medical

## 2019-02-15 NOTE — Telephone Encounter (Signed)
P.A. BELVIQ

## 2019-02-27 NOTE — Telephone Encounter (Signed)
P.A. approved til 05/18/19, faxed pharmacy, left message for pt

## 2019-03-02 ENCOUNTER — Encounter: Payer: Self-pay | Admitting: Medical

## 2019-03-02 ENCOUNTER — Ambulatory Visit: Payer: BC Managed Care – PPO | Admitting: Medical

## 2019-03-02 ENCOUNTER — Other Ambulatory Visit: Payer: Self-pay

## 2019-03-02 VITALS — BP 130/84 | HR 78 | Temp 98.3°F | Resp 16 | Ht 64.5 in | Wt 193.6 lb

## 2019-03-02 DIAGNOSIS — J4541 Moderate persistent asthma with (acute) exacerbation: Secondary | ICD-10-CM | POA: Diagnosis not present

## 2019-03-02 DIAGNOSIS — R05 Cough: Secondary | ICD-10-CM | POA: Diagnosis not present

## 2019-03-02 DIAGNOSIS — J45909 Unspecified asthma, uncomplicated: Secondary | ICD-10-CM | POA: Insufficient documentation

## 2019-03-02 DIAGNOSIS — R062 Wheezing: Secondary | ICD-10-CM | POA: Diagnosis not present

## 2019-03-02 DIAGNOSIS — J301 Allergic rhinitis due to pollen: Secondary | ICD-10-CM | POA: Diagnosis not present

## 2019-03-02 DIAGNOSIS — R059 Cough, unspecified: Secondary | ICD-10-CM

## 2019-03-02 MED ORDER — ALBUTEROL SULFATE 108 (90 BASE) MCG/ACT IN AEPB: 2.0000 | INHALATION_SPRAY | RESPIRATORY_TRACT | 1 refills | Status: DC | PRN

## 2019-03-02 MED ORDER — FLUTICASONE FUROATE-VILANTEROL 100-25 MCG/INH IN AEPB: 1.0000 | INHALATION_SPRAY | Freq: Every day | RESPIRATORY_TRACT | 2 refills | Status: DC

## 2019-03-02 MED ORDER — CETIRIZINE HCL 10 MG PO TABS: 10.0000 mg | ORAL_TABLET | Freq: Every day | ORAL | 2 refills | Status: DC

## 2019-03-02 NOTE — Patient Instructions (Signed)
Encounter Diagnoses  Name Primary?  . Allergic rhinitis due to pollen, unspecified seasonality Yes  . Cough   . Wheezing   . Moderate persistent extrinsic asthma with acute exacerbation    Recommendations  Begin back on albuterol emergency inhaler, 2 puffs every 4-6 hours as needed for wheezing or shortness of breath  Since you have had this pattern in the spring in the past, I also recommend getting back on Breo daily preventative inhaler 1 puff daily for the next 2 months.  Use one long deep inhale and then rinse your mouth out with water and swallow a few minutes after using this daily.  Continue cetirizine/Zyrtec allergy pill daily for the next 2 months  If you do not feel significant improvement in the next 10 days then let me know  The goal is within a week your symptoms will be much improved and you will not have to use the albuterol rescue inhaler much at all if the other medicines are working  Wear a mask when mowing or dusting or cleaning  Given the wheezing and cough I recommend you stay at home for the next 3 to 5 days minimum so you do not pick up any germs in the community, and to calm fears of your coworkers who see you coughing  If you do develop illness symptoms in the next few days such as fever, body aches, chills or worse symptoms suggestive of illness then let me know right away

## 2019-03-02 NOTE — Progress Notes (Signed)
Subjective:     Patient ID: [name], female   DOB: 1961-07-19, 58 y.o.   MRN: [id number]  HPI Chief Complaint  Patient presents with  . cough    cough, wheezing, sob, sneezing, X 5 days   Here for 5 day hx/o cough, wheezing, SOB, sneezing, runny nose.   No sinus pressure, no headache.   Lots of runny nose.  No sick contacts.   They deny any recent exposure to person with known Coronavirus COVID19+.  They deny any recent foreign travel.   No fever, nonproductive cough.  No chest pain, no leg swelling.  She has some muscle tenderness around waist, since not being able to do water aerobics, but no body aches or chills.  Using cetirizine.  Gets seasonal allergy problems in the spring.  Has had reactive airway, asthma symptoms in spring last few years with improvement on inhaler.   No other aggravating or relieving factors. No other complaint.   Past Medical History:  Diagnosis Date  . Allergy   . Anemia   . History of uterine fibroid   . Immune to hepatitis B 03/20/2001  . Immune to mumps 01/17/2006  . Immune to rubella 12/10/1983  . Obesity   . Reactive airway disease   . Wears dentures    partial  . Wears glasses    Current Outpatient Medications on File Prior to Visit  Medication Sig Dispense Refill  . cetirizine (ZYRTEC) 10 MG tablet Take 1 tablet (10 mg total) by mouth at bedtime. 30 tablet 2  . diphenhydramine-acetaminophen (TYLENOL PM) 25-500 MG TABS Take 1 tablet by mouth at bedtime as needed. Reported on 03/14/2016    . Multiple Vitamins-Minerals (MULTIVITAMIN WITH MINERALS) tablet Take 1 tablet by mouth daily.      . Albuterol Sulfate 108 (90 Base) MCG/ACT AEPB Inhale 2 puffs into the lungs every 4 (four) hours as needed. (Patient not taking: Reported on 03/02/2019) 1 each 1  . fluticasone furoate-vilanterol (BREO ELLIPTA) 100-25 MCG/INH AEPB Inhale 1 puff into the lungs daily. (Patient not taking: Reported on 03/02/2019) 28 each 2  . Lorcaserin HCl 10 MG TABS 1 tablet  daily x 1 week, the 1 tablet BID (Patient not taking: Reported on 03/02/2019) 60 tablet 1   No current facility-administered medications on file prior to visit.     Review of Systems As in subjective    Objective:   Physical Exam BP 130/84   Pulse 78   Temp 98.3 F (36.8 C) (Oral)   Resp 16   Ht 5' 4.5" (1.638 m)   Wt 193 lb 9.6 oz (87.8 kg)   SpO2 98%   BMI 32.72 kg/m   General appearance: Alert, WD/WN, no distress                             Skin: warm, no rash, no diaphoresis                           Head: no sinus tenderness                            Eyes: conjunctiva normal, corneas clear, PERRLA                            Ears: pearly TMs, external ear canals normal  Nose: septum midline, turbinates swollen, with mild erythema and clear discharge             Mouth/throat: MMM, tongue normal, no pharyngeal erythema                           Neck: supple, no adenopathy, no thyromegaly, non tender                          Heart: RRR, normal S1, S2, no murmurs                         Lungs: +bronchial breath sounds, +scattered rhonchi,+ scattered mild wheezes, no rales                Extremities: no edema, non tender         Assessment:     Encounter Diagnoses  Name Primary?  . Allergic rhinitis due to pollen, unspecified seasonality Yes  . Cough   . Wheezing   . Moderate persistent extrinsic asthma with acute exacerbation        Plan:     We discussed her symptoms and exam findings that suggest allergic asthma.  Discussed the recommendations below.  Patient Instructions   Encounter Diagnoses  Name Primary?  . Allergic rhinitis due to pollen, unspecified seasonality Yes  . Cough   . Wheezing   . Moderate persistent extrinsic asthma with acute exacerbation    Recommendations  Begin back on albuterol emergency inhaler, 2 puffs every 4-6 hours as needed for wheezing or shortness of breath  Since you have had this pattern in the  spring in the past, I also recommend getting back on Breo daily preventative inhaler 1 puff daily for the next 2 months.  Use one long deep inhale and then rinse your mouth out with water and swallow a few minutes after using this daily.  Continue cetirizine/Zyrtec allergy pill daily for the next 2 months  If you do not feel significant improvement in the next 10 days then let me know  The goal is within a week your symptoms will be much improved and you will not have to use the albuterol rescue inhaler much at all if the other medicines are working  Wear a mask when mowing or dusting or cleaning  Given the wheezing and cough I recommend you stay at home for the next 3 to 5 days minimum so you do not pick up any germs in the community, and to calm fears of your coworkers who see you coughing  If you do develop illness symptoms in the next few days such as fever, body aches, chills or worse symptoms suggestive of illness then let me know right away

## 2019-03-03 ENCOUNTER — Telehealth: Payer: Self-pay | Admitting: Medical

## 2019-03-03 ENCOUNTER — Other Ambulatory Visit: Payer: Self-pay | Admitting: Medical

## 2019-03-03 MED ORDER — ALBUTEROL SULFATE HFA 108 (90 BASE) MCG/ACT IN AERS: 2.0000 | INHALATION_SPRAY | Freq: Four times a day (QID) | RESPIRATORY_TRACT | 0 refills | Status: DC | PRN

## 2019-03-03 NOTE — Telephone Encounter (Signed)
done

## 2019-03-03 NOTE — Telephone Encounter (Signed)
[facility] is requesting a a change in RX states that her insurance will not cover proair respi  aer they are requesting we change to proair HFA or VENTOLIN HFA  Please send to [place], [place] - [address]

## 2019-03-15 ENCOUNTER — Other Ambulatory Visit: Payer: Self-pay | Admitting: Medical

## 2019-03-15 ENCOUNTER — Telehealth: Payer: Self-pay | Admitting: Family Medicine

## 2019-03-15 MED ORDER — BENZONATATE 200 MG PO CAPS: 200.0000 mg | ORAL_CAPSULE | Freq: Three times a day (TID) | ORAL | 0 refills | Status: DC | PRN

## 2019-03-15 NOTE — Telephone Encounter (Signed)
Pt said you dx her with seasonal allergies.  But her cough has not eased up any.  Pt is very concerned [phone].

## 2019-03-15 NOTE — Telephone Encounter (Signed)
I called and spoke to her.   She has another day left on antibiotic, which was started for sinus infection after her symptoms changed last week to more of a sinusitis picture.   Overall much improved, just a nagging cough now.  bro Breo inhaler seems to help.  She is still using zyrtec.  No current fever, body aches ,chills, NVD, head pressure or sore thraot.  She has quarantined at home as a precaution since the telehealth visit.     I advised she c/t zyrtec through May and can use Tessalon Perles prn for cough suppression.   She should be clear to go back to work now.

## 2019-03-15 NOTE — Telephone Encounter (Signed)
Patient states that she has a cough, SOB/weekness, headache.

## 2019-03-15 NOTE — Telephone Encounter (Signed)
Call and get more info about current symptoms? Just nagging cough? Or feeling sick?

## 2019-06-01 LAB — HM MAMMOGRAPHY

## 2019-07-06 ENCOUNTER — Other Ambulatory Visit: Payer: Self-pay

## 2019-07-06 ENCOUNTER — Ambulatory Visit: Payer: BC Managed Care – PPO | Admitting: Medical

## 2019-07-06 ENCOUNTER — Encounter: Payer: Self-pay | Admitting: Medical

## 2019-07-06 VITALS — BP 130/80 | HR 80 | Temp 98.9°F | Resp 16 | Wt 198.0 lb

## 2019-07-06 DIAGNOSIS — L03319 Cellulitis of trunk, unspecified: Secondary | ICD-10-CM | POA: Diagnosis not present

## 2019-07-06 DIAGNOSIS — L02219 Cutaneous abscess of trunk, unspecified: Secondary | ICD-10-CM | POA: Diagnosis not present

## 2019-07-06 LAB — GLUCOSE, POCT (MANUAL RESULT ENTRY): POC Glucose: 85 mg/dl (ref 70–99)

## 2019-07-06 MED ORDER — SULFAMETHOXAZOLE-TRIMETHOPRIM 800-160 MG PO TABS: 1.0000 | ORAL_TABLET | Freq: Two times a day (BID) | ORAL | 0 refills | Status: DC

## 2019-07-06 MED ORDER — HYDROCODONE-ACETAMINOPHEN 5-325 MG PO TABS: 1.0000 | ORAL_TABLET | Freq: Four times a day (QID) | ORAL | 0 refills | Status: DC | PRN

## 2019-07-06 NOTE — Progress Notes (Signed)
  Subjective:   [name] is a 58 y.o. female who presents for evaluation of a probable cutaneous abscess. Lesion is located in the left lower abdomen/flank. Onset was 4 days ago. Symptoms have gradually worsened.  Abscess has associated symptoms of spontaneous drainage, pain.  Started as a possible bug bite, bump, quarter size, then next day a bubble appearing redness.  Within last 2 days worse redness and raised up area, worse pain.   Spontaneous drainage.   No prior similar .  Patient denies hx/o MRSA.  Patient denies hx/o I&D for similar.  Patient does not have diabetes.  Patient denies hx/o poor wound healing, compromised immunity or HIV.  No other aggravating or relieving factors.  No other c/o.  Past Medical History:  Diagnosis Date  . Allergy   . Anemia   . History of uterine fibroid   . Immune to hepatitis B 03/20/2001  . Immune to mumps 01/17/2006  . Immune to rubella 12/10/1983  . Obesity   . Reactive airway disease   . Wears dentures    partial  . Wears glasses    Current Outpatient Medications on File Prior to Visit  Medication Sig Dispense Refill  . diphenhydramine-acetaminophen (TYLENOL PM) 25-500 MG TABS Take 1 tablet by mouth at bedtime as needed. Reported on 03/14/2016    . Multiple Vitamins-Minerals (MULTIVITAMIN WITH MINERALS) tablet Take 1 tablet by mouth daily.      . cetirizine (ZYRTEC) 10 MG tablet Take 1 tablet (10 mg total) by mouth at bedtime. (Patient not taking: Reported on 07/06/2019) 30 tablet 2   No current facility-administered medications on file prior to visit.      Reviewed prior allergies, medications, past medical history, past surgical history.  ROS as in subjective   Objective:   BP 130/80   Pulse 80   Temp 98.9 F (37.2 C) (Temporal)   Resp 16   Wt 198 lb (89.8 kg)   SpO2 98%   BMI 33.46 kg/m   Gen: wd, wn, nad Skin: Left lateral lower abdomen with 6 cm area of erythema, induration raised tender lesion with crusted center.  No  obvious fluctuance but evidence of recent drainage.  No other skin lesions    Assessment:   Encounter Diagnosis  Name Primary?  . Cellulitis and abscess of trunk Yes      Plan:   We discussed findings.   Gave options for antibiotics vs I&D.  No obvious fluctuance.   There has been spontaneous drainage already.  She will being course of oral antibiotics, use warm compresses or heat applied to the area to promote drainage.   Can use Nocro prn, caution with sedation.   Glucose fingerstick reviewed today.    Discussed usual time frame to see improvements.   Discussed hygiene and means of contagion.   if worse signs of infections as discussed (fever, chills, nausea, vomiting, worsening redness, worsening pain), then call or return immediately.  Otherwise f/u 1 wk

## 2019-07-06 NOTE — Patient Instructions (Signed)

## 2019-09-28 ENCOUNTER — Other Ambulatory Visit: Payer: Self-pay

## 2019-09-28 ENCOUNTER — Other Ambulatory Visit (INDEPENDENT_AMBULATORY_CARE_PROVIDER_SITE_OTHER): Payer: BC Managed Care – PPO

## 2019-09-28 DIAGNOSIS — Z23 Encounter for immunization: Secondary | ICD-10-CM | POA: Diagnosis not present

## 2019-11-18 ENCOUNTER — Other Ambulatory Visit: Payer: Self-pay

## 2019-11-18 ENCOUNTER — Ambulatory Visit (INDEPENDENT_AMBULATORY_CARE_PROVIDER_SITE_OTHER): Payer: Self-pay | Admitting: Plastic Surgery

## 2019-11-18 ENCOUNTER — Encounter: Payer: Self-pay | Admitting: Plastic Surgery

## 2019-11-18 VITALS — BP 115/74 | HR 72 | Temp 98.0°F | Ht 64.5 in | Wt 197.4 lb

## 2019-11-18 DIAGNOSIS — Z411 Encounter for cosmetic surgery: Secondary | ICD-10-CM

## 2019-11-18 NOTE — Progress Notes (Signed)
   Referring Provider Tysinger, Camelia Eng, PA-C [address] [place],  [place] [address]   CC:  Chief Complaint  Patient presents with  . Advice Only    for ear lobe repair      [name] is an 58 y.o. female.  HPI: Patient presents to discuss enlarging ear piercing holes bilaterally.  She says is been present for couple years and she has been considering getting it fixed because she is bothered by wearing earrings that seem to dangle and cause a enlarging slit in her ear.  She has not had them repaired before  Review of Systems General: Denies fevers, chills, weight loss  Physical Exam Vitals with BMI 11/18/2019 07/06/2019 03/02/2019  Height 5' 4.5" - 5' 4.5"  Weight 197 lbs 6 oz 198 lbs 193 lbs 10 oz  BMI [id number]  Systolic [id number]  Diastolic 74 80 84  Pulse 72 80 78    General:  No acute distress,  Alert and oriented, Non-Toxic, Normal speech and affect Examination of the ears shows 2 ear piercing holes on each ear lobe.  The central piercing hole is stretched out on both sides but not torn all the way through.  The earlobe overall appears to have some laxity to it as well.  Assessment/Plan I think she is a reasonably good candidate for earlobe repair.  I explained the procedure in detail to her.  I explained the risk that include bleeding, infection, demonstrating structures, need for additional procedures.  I explained I would take out a wedge of the earlobe and repair this and then we let it heal for at least a couple months before repiercing it again.  Ideally we would we would repierce it in a different place which she agrees with.  She is interested in moving forward so we will plan to schedule this to be done in the office.  Cindra Presume 11/18/2019, 8:54 AM

## 2019-12-09 LAB — HM COLONOSCOPY

## 2019-12-13 ENCOUNTER — Telehealth: Payer: Self-pay | Admitting: Gastroenterology

## 2019-12-13 ENCOUNTER — Other Ambulatory Visit: Payer: Self-pay

## 2019-12-13 ENCOUNTER — Ambulatory Visit (HOSPITAL_COMMUNITY)
Admission: EM | Admit: 2019-12-13 | Discharge: 2019-12-13 | Disposition: A | Discharge status: Home / Self Care | Payer: BC Managed Care – PPO | Attending: Family Medicine | Admitting: Family Medicine

## 2019-12-13 ENCOUNTER — Encounter (HOSPITAL_COMMUNITY): Payer: Self-pay

## 2019-12-13 DIAGNOSIS — M545 Low back pain, unspecified: Secondary | ICD-10-CM

## 2019-12-13 MED ORDER — PREDNISONE 10 MG (21) PO TBPK: ORAL_TABLET | Freq: Every day | ORAL | 0 refills | Status: DC

## 2019-12-13 NOTE — Discharge Instructions (Addendum)

## 2019-12-13 NOTE — Telephone Encounter (Signed)
Received call to on-call. Colonoscopy completed last week for routine screening by Dr. Benson Norway. She reports n/f 2 small polyps. No pain/GI sxs prior to procedure.   Pain on right side since the day following colonoscopy. Not improving. Pain radiates to the lower back. Worse sleeping on right. Pain somewhat in left too now. No subjective fever. Tried heating pads and APAP with minimal improvement. No change with muscle relax creams. No hematochezia. Has had BM and tolerating PO intake without issue. No n/v.   Discussed full ddx for post procedure abdominal pain. Given prolonged duration and described worsening pain, felt prudent to to go UC for evaluation, to include exam, labs, and imaging. Will forward message to her primary GI as well. All questions answered and patient appreciative for call back and advice.

## 2019-12-13 NOTE — ED Triage Notes (Signed)
Pt presents with lower back pain that radiates down right leg since Friday.

## 2019-12-13 NOTE — ED Provider Notes (Signed)
[facility]   [id number] 12/13/19 Arrival Time: [id number]  ASSESSMENT & PLAN:  1. Acute right-sided low back pain without sciatica     Able to ambulate here and hemodynamically stable. No indication for imaging of back at this time given no trauma and normal neurological exam. Discussed.  Begin trial of: Meds ordered this encounter  Medications  . predniSONE (STERAPRED UNI-PAK 21 TAB) 10 MG (21) TBPK tablet    Sig: Take by mouth daily. Take as directed.    Dispense:  21 tablet    Refill:  0    Continue Tylenol. Encourage ROM/movement as tolerated.  Recommend: Follow-up Information    Tysinger, Camelia Eng, PA-C.   Specialty: Family Medicine Why: If worsening or failing to improve as anticipated. Contact information: [address] [place] [address] [phone]           Reviewed expectations re: course of current medical issues. Questions answered. Outlined signs and symptoms indicating need for more acute intervention. Patient verbalized understanding. After Visit Summary given.   SUBJECTIVE: History from: patient.  [name] is a 59 y.o. female who presents with complaint of intermittent right sided lower back discomfort. Onset abrupt. First noted approx 3 days ago. Injury/trama: no. Reports bending forward and feeling pain in her back. Difficulty moving around since. History of back problems requiring medical care: rare. Pain described as aching and throbbing without specific radiation but does report feeling pain in her R hip. No radicular symptoms. Aggravating factors: certain movements and prolonged walking/standing. Alleviating factors: have not been identified. Progressive LE weakness or saddle anesthesia: none. Extremity sensation changes or weakness: none. Ambulatory without difficulty; uses cane. Normal bowel/bladder habits: no; without urinary retention. Normal PO intake without n/v. No associated abdominal pain/n/v. Self treatment: has  acetaminophen, with minimal relief.  Reports no chronic steroid use, fevers, IV drug use, or recent back surgeries or procedures.  ROS: As per HPI. All other systems negative.   OBJECTIVE:  Vitals:   12/13/19 0859  BP: 122/66  Pulse: 72  Resp: 17  Temp: 98.2 F (36.8 C)  TempSrc: Oral  SpO2: 100%    General appearance: alert; no distress HEENT: [place]; AT Neck: supple with FROM; without midline tenderness CV: RRR Lungs: unlabored respirations; symmetrical air entry Abdomen: soft, non-tender; non-distended Back: mild  and poorly localized tenderness to palpation over R lower back and R buttock; FROM at waist; bruising: none; without midline tenderness Extremities: without edema; symmetrical without gross deformities; normal ROM of bilateral LE Skin: warm and dry Neurologic: normal gait; normal reflexes of bilateral LE; normal sensation of bilateral LE; normal strength of bilateral LE Psychological: alert and cooperative; normal mood and affect    Allergies  Allergen Reactions  . Aspirin Hives    hives  . Shellfish Allergy Hives    Past Medical History:  Diagnosis Date  . Allergy   . Anemia   . History of uterine fibroid   . Immune to hepatitis B 03/20/2001  . Immune to mumps 01/17/2006  . Immune to rubella 12/10/1983  . Obesity   . Reactive airway disease   . Wears dentures    partial  . Wears glasses    Social History   Socioeconomic History  . Marital status: Single    Spouse name: Not on file  . Number of children: Not on file  . Years of education: Not on file  . Highest education level: Not on file  Occupational History  . Not on file  Tobacco Use  . Smoking status: Former Smoker    Packs/day: 0.25    Years: 15.00    Pack years: 3.75    Types: Cigarettes    Quit date: 12/10/1983    Years since quitting: 36.0  . Smokeless tobacco: Never Used  Substance and Sexual Activity  . Alcohol use: Yes    Alcohol/week: 1.0 standard drinks    Types: 1  Glasses of wine per week    Comment: occasionally  . Drug use: No  . Sexual activity: Not Currently    Partners: Male    Birth control/protection: Other-see comments    Comment: BTL  Other Topics Concern  . Not on file  Social History Narrative   Has 3 children, all adults.   1 child in [place], 2 in [place]. Exercise - treadmill, elliptical.  [employer].  Divorced, no significant other, lives with grand-daughter.  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  08/2018   Social Determinants of Health   Financial Resource Strain:   . Difficulty of Paying Living Expenses: Not on file  Food Insecurity:   . Worried About Charity fundraiser in the Last Year: Not on file  . Ran Out of Food in the Last Year: Not on file  Transportation Needs:   . Lack of Transportation (Medical): Not on file  . Lack of Transportation (Non-Medical): Not on file  Physical Activity:   . Days of Exercise per Week: Not on file  . Minutes of Exercise per Session: Not on file  Stress:   . Feeling of Stress : Not on file  Social Connections:   . Frequency of Communication with Friends and Family: Not on file  . Frequency of Social Gatherings with Friends and Family: Not on file  . Attends Religious Services: Not on file  . Active Member of Clubs or Organizations: Not on file  . Attends Archivist Meetings: Not on file  . Marital Status: Not on file  Intimate Partner Violence:   . Fear of Current or Ex-Partner: Not on file  . Emotionally Abused: Not on file  . Physically Abused: Not on file  . Sexually Abused: Not on file   Family History  Problem Relation Age of Onset  . Diabetes Mother   . Hyperlipidemia Mother   . Eclampsia Mother   . Seizures Mother        died of seizure  . Asthma Sister   . Hypertension Sister   . Diabetes Sister   . Cancer Maternal Grandmother   . Hypertension Maternal Grandmother   .  Hypertension Father   . Gout Father   . Hypertension Maternal Grandfather   . Hypertension Paternal Grandmother   . Heart disease Neg Hx    Past Surgical History:  Procedure Laterality Date  . COLONOSCOPY  03/2010   hyperplastic polyp, repeat 2021; Dr. Virgel Bouquet  . HYSTEROSCOPY D AND C    . MYOMECTOMY  11/1998  . ROBOT ASSISTED MYOMECTOMY  04/08/2012   Procedure: ROBOTIC ASSISTED MYOMECTOMY;  Surgeon: Alwyn Pea, MD;  Location: [facility];  Service: Gynecology;  Laterality: N/A;  . TONSILECTOMY, ADENOIDECTOMY, BILATERAL MYRINGOTOMY AND TUBES    . TUBAL LIGATION       Vanessa Kick, MD 12/13/19 [phone]

## 2019-12-22 ENCOUNTER — Telehealth: Payer: Self-pay | Admitting: Plastic Surgery

## 2019-12-22 NOTE — Telephone Encounter (Signed)

## 2019-12-23 ENCOUNTER — Ambulatory Visit (INDEPENDENT_AMBULATORY_CARE_PROVIDER_SITE_OTHER): Payer: Self-pay | Admitting: Plastic Surgery

## 2019-12-23 ENCOUNTER — Encounter: Payer: Self-pay | Admitting: Plastic Surgery

## 2019-12-23 ENCOUNTER — Other Ambulatory Visit: Payer: Self-pay

## 2019-12-23 VITALS — BP 114/75 | HR 88 | Temp 98.4°F | Ht 65.0 in | Wt 197.6 lb

## 2019-12-23 DIAGNOSIS — Z411 Encounter for cosmetic surgery: Secondary | ICD-10-CM

## 2019-12-23 NOTE — Progress Notes (Signed)
Operative Note   DATE OF OPERATION: 12/23/2019  LOCATION:    SURGICAL DEPARTMENT: Plastic Surgery  PREOPERATIVE DIAGNOSES: Bilateral split earlobes  POSTOPERATIVE DIAGNOSES:  same  PROCEDURE:  1. Bilateral split earlobe repair  SURGEON: Talmadge Coventry, MD  ANESTHESIA:  Local  COMPLICATIONS: None.   INDICATIONS FOR PROCEDURE:  The patient, [name] is a 59 y.o. female born on 06-Nov-1961, is here for treatment of bilateral split earlobes MRN: [id number]  CONSENT:  Informed consent was obtained directly from the patient. Risks, benefits and alternatives were fully discussed. Specific risks including but not limited to bleeding, infection, hematoma, seroma, scarring, pain, infection, wound healing problems, and need for further surgery were all discussed. The patient did have an ample opportunity to have questions answered to satisfaction.   DESCRIPTION OF PROCEDURE:  Local anesthesia was administered. The patient's operative site was prepped and draped in a sterile fashion. A time out was performed and all information was confirmed to be correct.  Elongated ear piercing hole was excised with an 11 blade.  Closure was done with a Prolene mattress suture to line up the rim of the lobule followed by interrupted Prolene sutures anteriorly and interrupted plain gut sutures posteriorly.  This gave a nice result on both sides.  The patient tolerated the procedure well.  There were no complications.

## 2019-12-27 ENCOUNTER — Ambulatory Visit: Payer: BC Managed Care – PPO | Admitting: Family Medicine

## 2019-12-27 ENCOUNTER — Other Ambulatory Visit: Payer: Self-pay

## 2019-12-27 ENCOUNTER — Encounter: Payer: Self-pay | Admitting: Medical

## 2019-12-27 ENCOUNTER — Ambulatory Visit: Payer: BC Managed Care – PPO | Admitting: Medical

## 2019-12-27 VITALS — BP 122/80 | HR 70 | Temp 97.7°F | Ht 64.5 in | Wt 197.6 lb

## 2019-12-27 DIAGNOSIS — L732 Hidradenitis suppurativa: Secondary | ICD-10-CM | POA: Diagnosis not present

## 2019-12-27 DIAGNOSIS — L02411 Cutaneous abscess of right axilla: Secondary | ICD-10-CM

## 2019-12-27 MED ORDER — HYDROCODONE-ACETAMINOPHEN 7.5-325 MG PO TABS: 1.0000 | ORAL_TABLET | Freq: Four times a day (QID) | ORAL | 0 refills | Status: AC | PRN

## 2019-12-27 MED ORDER — SULFAMETHOXAZOLE-TRIMETHOPRIM 800-160 MG PO TABS: 1.0000 | ORAL_TABLET | Freq: Two times a day (BID) | ORAL | 0 refills | Status: DC

## 2019-12-27 NOTE — Progress Notes (Signed)
  Subjective:   [name] is a 59 y.o. female who presents for evaluation of a probable cutaneous abscess. Lesion is located in the right axilla.. Onset was 4 days ago. Symptoms have gradually worsened.  Abscess has associated symptoms of spontaneous drainage, pain.    Of note, she was taking Prednisone at the first of this year for back flare up per urgent care.    Patient does have previous history of cutaneous abscesses.   Patient denies hx/o MRSA.  Patient denies hx/o I&D for similar.  Patient does not have diabetes.  Patient denies hx/o poor wound healing, compromised immunity or HIV.  No other aggravating or relieving factors.  No other c/o.  Past Medical History:  Diagnosis Date  . Allergy   . Anemia   . History of uterine fibroid   . Immune to hepatitis B 03/20/2001  . Immune to mumps 01/17/2006  . Immune to rubella 12/10/1983  . Obesity   . Reactive airway disease   . Wears dentures    partial  . Wears glasses     Current Outpatient Medications on File Prior to Visit  Medication Sig Dispense Refill  . cetirizine (ZYRTEC) 10 MG tablet Take 1 tablet (10 mg total) by mouth at bedtime. (Patient not taking: Reported on 12/27/2019) 30 tablet 2  . diphenhydramine-acetaminophen (TYLENOL PM) 25-500 MG TABS Take 1 tablet by mouth at bedtime as needed. Reported on 03/14/2016    . Multiple Vitamins-Minerals (MULTIVITAMIN WITH MINERALS) tablet Take 1 tablet by mouth daily.      . predniSONE (STERAPRED UNI-PAK 21 TAB) 10 MG (21) TBPK tablet Take by mouth daily. Take as directed. (Patient not taking: Reported on 12/27/2019) 21 tablet 0   No current facility-administered medications on file prior to visit.    Reviewed prior allergies, medications, past medical history, past surgical history.  ROS as in subjective   Objective:   BP 122/80   Pulse 70   Temp 97.7 F (36.5 C)   Ht 5' 4.5" (1.638 m)   Wt 197 lb 9.6 oz (89.6 kg)   SpO2 99%   BMI 33.39 kg/m   Gen: wd, wn,  nad Skin: There is an area characterized by a soft mobile subQ mass, erythema surrounding area measuring 4 cm, induration, fluctuance, tenderness, purulent drainage 4cm in greatest dimension. Location: right axilla.   Assessment:   Encounter Diagnoses  Name Primary?  . Hidradenitis suppurativa Yes  . Cutaneous abscess of right axilla       Plan:   Discussed examination findings, diagnosis, usual course of illness, and options for therapy discussed. After discussing recommendations, patient agrees to I&D, oral antibiotics.    Procedure Informed consent obtained.  The area was prepped in the usual manner and the skin overlying the abscess was anesthetized with 4cc of 1% lidocaine with epinephrine.  The area was sharply incised and approx 3ccs of purulent material was obtained.  Area was irrigated with high pressure saline. Packing was inserted. Wound was covered with sterile bandage.    Advised patient to complete the course of oral antibiotics, use warm compresses or heat applied to the area to promote drainage.  Follow up: 3 days.  However, if worse signs of infections as discussed (fever, chills, nausea, vomiting, worsening redness, worsening pain), then call or return immediately.

## 2019-12-27 NOTE — Patient Instructions (Signed)
Recommendations:  Begin Bactrim antibiotic twice daily for the next week  You can use the hydrocodone pain medicine as needed up to every 4-6 hours.  Caution as this can cause sedation  You can use warm compresses for the next couple days.  Plan to return in 3days i can recheck the wound and remove the packing  If fever, chills, worse symptoms in the meantime let me know right away   Abscess An abscess (boil or furuncle) is an infected area that contains a collection of pus.  SYMPTOMS Signs and symptoms of an abscess include pain, tenderness, redness, or hardness. You may feel a moveable soft area under your skin. An abscess can occur anywhere in the body.  TREATMENT  A surgical cut (incision) may be made over your abscess to drain the pus. Gauze may be packed into the space or a drain may be looped through the abscess cavity (pocket). This provides a drain that will allow the cavity to heal from the inside outwards. The abscess may be painful for a few days, but should feel much better if it was drained.  Your abscess, if seen early, may not have localized and may not have been drained. If not, another appointment may be required if it does not get better on its own or with medications. HOME CARE INSTRUCTIONS   Only take over-the-counter or prescription medicines for pain, discomfort, or fever as directed by your caregiver.   Take your antibiotics as directed if they were prescribed. Finish them even if you start to feel better.   Keep the skin and clothes clean around your abscess.   If the abscess was drained, you will need to use gauze dressing to collect any draining pus. Dressings will typically need to be changed 3 or more times a day.   The infection may spread by skin contact with others. Avoid skin contact as much as possible.   Practice good hygiene. This includes regular hand washing, cover any draining skin lesions, and do not share personal care items.   If you  participate in sports, do not share athletic equipment, towels, whirlpools, or personal care items. Shower after every practice or tournament.   If a draining area cannot be adequately covered:   Do not participate in sports.   Children should not participate in day care until the wound has healed or drainage stops.   If your caregiver has given you a follow-up appointment, it is very important to keep that appointment. Not keeping the appointment could result in a much worse infection, chronic or permanent injury, pain, and disability. If there is any problem keeping the appointment, you must call back to this facility for assistance.  SEEK MEDICAL CARE IF:   You develop increased pain, swelling, redness, drainage, or bleeding in the wound site.   You develop signs of generalized infection including muscle aches, chills, fever, or a general ill feeling.   You have an oral temperature above 102 F (38.9 C).  MAKE SURE YOU:   Understand these instructions.   Will watch your condition.   Will get help right away if you are not doing well or get worse.  Document Released: 09/04/2005 Document Revised: 08/07/2011 Document Reviewed: 06/28/2008 [facility] Patient Information 2012 [place].

## 2019-12-29 LAB — WOUND CULTURE

## 2019-12-30 ENCOUNTER — Encounter: Payer: Self-pay | Admitting: Medical

## 2019-12-30 ENCOUNTER — Other Ambulatory Visit: Payer: Self-pay

## 2019-12-30 ENCOUNTER — Ambulatory Visit: Payer: BC Managed Care – PPO | Admitting: Medical

## 2019-12-30 VITALS — BP 120/72 | HR 75 | Temp 98.0°F | Ht 64.5 in | Wt 196.0 lb

## 2019-12-30 DIAGNOSIS — L0291 Cutaneous abscess, unspecified: Secondary | ICD-10-CM | POA: Insufficient documentation

## 2019-12-30 DIAGNOSIS — L732 Hidradenitis suppurativa: Secondary | ICD-10-CM | POA: Insufficient documentation

## 2019-12-30 NOTE — Progress Notes (Signed)
  Subjective:   [name] is a 59 y.o. female who presents for recheck on abscess of right axilla.  I saw her 3 days ago for I incision and drainage of this abscess.  She notes much improvement.  There is still some drainage but much less pain much less swelling.  She still has several days of antibiotic left.  She notes that the packing fell out yesterday.  No other aggravating or relieving factors.  No other c/o.  Past Medical History:  Diagnosis Date  . Allergy   . Anemia   . History of uterine fibroid   . Immune to hepatitis B 03/20/2001  . Immune to mumps 01/17/2006  . Immune to rubella 12/10/1983  . Obesity   . Reactive airway disease   . Wears dentures    partial  . Wears glasses     Current Outpatient Medications on File Prior to Visit  Medication Sig Dispense Refill  . diphenhydramine-acetaminophen (TYLENOL PM) 25-500 MG TABS Take 1 tablet by mouth at bedtime as needed. Reported on 03/14/2016    . Multiple Vitamins-Minerals (MULTIVITAMIN WITH MINERALS) tablet Take 1 tablet by mouth daily.      . cetirizine (ZYRTEC) 10 MG tablet Take 1 tablet (10 mg total) by mouth at bedtime. (Patient not taking: Reported on 12/27/2019) 30 tablet 2  . HYDROcodone-acetaminophen (NORCO) 7.5-325 MG tablet Take 1 tablet by mouth every 6 (six) hours as needed for up to 5 days for moderate pain. (Patient not taking: Reported on 12/30/2019) 10 tablet 0  . predniSONE (STERAPRED UNI-PAK 21 TAB) 10 MG (21) TBPK tablet Take by mouth daily. Take as directed. (Patient not taking: Reported on 12/27/2019) 21 tablet 0  . sulfamethoxazole-trimethoprim (BACTRIM DS) 800-160 MG tablet Take 1 tablet by mouth 2 (two) times daily. (Patient not taking: Reported on 12/30/2019) 14 tablet 0   No current facility-administered medications on file prior to visit.    Reviewed prior allergies, medications, past medical history, past surgical history.  ROS as in subjective   Objective:   BP 120/72   Pulse 75   Temp 98  F (36.7 C)   Ht 5' 4.5" (1.638 m)   Wt 196 lb (88.9 kg)   SpO2 98%   BMI 33.12 kg/m   Gen: wd, wn, nad Skin: There is an area characterized by mild swelling approximately 2.5 cm diameter raised pinkish area today, less tender less swollen than 3 days ago.  No packing in place currently.  The incision hole from 3 days ago has started to close since the packing fell out    Assessment:   Encounter Diagnoses  Name Primary?  . Hidradenitis axillaris Yes  . Cutaneous abscess, unspecified site       Plan:   Improving.  Continue antibiotic.  Continue warm compresses.  We discussed the culture results which was not MRSA.  Advise if the lesion is not 90% resolved by the time the antibiotic is finished to call back and we will extend the antibiotic some more

## 2020-01-05 ENCOUNTER — Telehealth: Payer: Self-pay

## 2020-01-05 NOTE — Telephone Encounter (Signed)

## 2020-01-06 ENCOUNTER — Other Ambulatory Visit: Payer: Self-pay

## 2020-01-06 ENCOUNTER — Ambulatory Visit (INDEPENDENT_AMBULATORY_CARE_PROVIDER_SITE_OTHER): Payer: Self-pay | Admitting: Surgical

## 2020-01-06 ENCOUNTER — Encounter: Payer: Self-pay | Admitting: Surgical

## 2020-01-06 VITALS — BP 118/76 | HR 69 | Temp 97.8°F | Ht 64.0 in | Wt 196.2 lb

## 2020-01-06 DIAGNOSIS — Z411 Encounter for cosmetic surgery: Secondary | ICD-10-CM

## 2020-01-06 NOTE — Progress Notes (Signed)
The patient is a 59 year old female here for follow-up after repair of bilateral earlobes.  She reports she is doing well.  She reports some pain on the right ear lobe, but it is manageable.  No other complaints.  Her incisions are well-healed.  No erythema.  No drainage noted.  Prolene sutures removed from bilateral lobes, follow-up in 3 months for piercing.  Call with any questions or concerns prior to that visit.

## 2020-02-15 ENCOUNTER — Ambulatory Visit: Payer: BC Managed Care – PPO | Admitting: Medical

## 2020-02-15 ENCOUNTER — Ambulatory Visit
Admission: RE | Admit: 2020-02-15 | Discharge: 2020-02-15 | Disposition: A | Discharge status: Home / Self Care | Payer: BC Managed Care – PPO | Source: Ambulatory Visit | Attending: Medical | Admitting: Medical

## 2020-02-15 ENCOUNTER — Encounter: Payer: Self-pay | Admitting: Medical

## 2020-02-15 ENCOUNTER — Other Ambulatory Visit: Payer: Self-pay

## 2020-02-15 VITALS — BP 130/70 | HR 71 | Temp 98.7°F | Ht 64.0 in | Wt 200.2 lb

## 2020-02-15 DIAGNOSIS — M25512 Pain in left shoulder: Secondary | ICD-10-CM

## 2020-02-15 DIAGNOSIS — G8929 Other chronic pain: Secondary | ICD-10-CM

## 2020-02-15 DIAGNOSIS — Z86018 Personal history of other benign neoplasm: Secondary | ICD-10-CM

## 2020-02-15 DIAGNOSIS — E669 Obesity, unspecified: Secondary | ICD-10-CM

## 2020-02-15 DIAGNOSIS — Z0000 Encounter for general adult medical examination without abnormal findings: Secondary | ICD-10-CM

## 2020-02-15 DIAGNOSIS — J4520 Mild intermittent asthma, uncomplicated: Secondary | ICD-10-CM | POA: Insufficient documentation

## 2020-02-15 DIAGNOSIS — L732 Hidradenitis suppurativa: Secondary | ICD-10-CM

## 2020-02-15 DIAGNOSIS — Z7185 Encounter for immunization safety counseling: Secondary | ICD-10-CM

## 2020-02-15 DIAGNOSIS — Z7189 Other specified counseling: Secondary | ICD-10-CM

## 2020-02-15 DIAGNOSIS — M79602 Pain in left arm: Secondary | ICD-10-CM

## 2020-02-15 DIAGNOSIS — Z136 Encounter for screening for cardiovascular disorders: Secondary | ICD-10-CM

## 2020-02-15 DIAGNOSIS — J301 Allergic rhinitis due to pollen: Secondary | ICD-10-CM

## 2020-02-15 DIAGNOSIS — Z113 Encounter for screening for infections with a predominantly sexual mode of transmission: Secondary | ICD-10-CM

## 2020-02-15 DIAGNOSIS — Z1159 Encounter for screening for other viral diseases: Secondary | ICD-10-CM

## 2020-02-15 LAB — POCT URINALYSIS DIP (PROADVANTAGE DEVICE)
Bilirubin, UA: NEGATIVE
Blood, UA: NEGATIVE
Glucose, UA: NEGATIVE mg/dL
Ketones, POC UA: NEGATIVE mg/dL
Leukocytes, UA: NEGATIVE
Nitrite, UA: NEGATIVE
Protein Ur, POC: NEGATIVE mg/dL
Specific Gravity, Urine: 1.015
Urobilinogen, Ur: NEGATIVE
pH, UA: 6 (ref 5.0–8.0)

## 2020-02-15 NOTE — Progress Notes (Signed)
Subjective:   HPI  [name] is a 59 y.o. female who presents for a complete physical.  Medical care team includes:  Dr. Fayne Mediate, gynecology  Dorothea Ogle, PA-C   Dr. Archie Balboa, dentist  Dr. Delman Cheadle, ophthalmology  Dr. Carol Ada, GI  ortho  Concerns:  Had colonoscopy 2/31/2020, had some polyps.  Was advised to repeat in 7 years.  Had mammogram 2020 through gyn, had pap as well.  Getting Covid vaccine #1 this week.  Still has ongoing pains in left upper arm, possibly shoulder, but achy.  No injury, no trauma, no fall.  No SOB, no chest pain, no paresthesias.  No swelling. Attributes some of the pain to excess skin of her upper arms.  Reviewed their medical, surgical, family, social, medication, and allergy history and updated chart as appropriate.  Past Medical History:  Diagnosis Date  . Allergy   . Anemia   . History of uterine fibroid   . Immune to hepatitis B 03/20/2001  . Immune to mumps 01/17/2006  . Immune to rubella 12/10/1983  . Obesity   . Reactive airway disease   . Wears dentures    partial  . Wears glasses     Past Surgical History:  Procedure Laterality Date  . COLONOSCOPY  03/2010   hyperplastic polyp, repeat 2021; Dr. Virgel Bouquet  . HYSTEROSCOPY D AND C    . MYOMECTOMY  11/1998  . ROBOT ASSISTED MYOMECTOMY  04/08/2012   Procedure: ROBOTIC ASSISTED MYOMECTOMY;  Surgeon: Alwyn Pea, MD;  Location: [facility];  Service: Gynecology;  Laterality: N/A;  . TONSILECTOMY, ADENOIDECTOMY, BILATERAL MYRINGOTOMY AND TUBES    . TUBAL LIGATION      Social History   Socioeconomic History  . Marital status: Single    Spouse name: Not on file  . Number of children: Not on file  . Years of education: Not on file  . Highest education level: Not on file  Occupational History  . Not on file  Tobacco Use  . Smoking status: Former Smoker    Packs/day: 0.25    Years: 15.00    Pack years: 3.75    Types: Cigarettes    Quit date: 12/10/1983     Years since quitting: 36.2  . Smokeless tobacco: Never Used  Substance and Sexual Activity  . Alcohol use: Yes    Alcohol/week: 1.0 standard drinks    Types: 1 Glasses of wine per week    Comment: occasionally  . Drug use: No  . Sexual activity: Not Currently    Partners: Male    Birth control/protection: Other-see comments    Comment: BTL  Other Topics Concern  . Not on file  Social History Narrative   Has 3 children, all adults.   1 child in [place], 2 in [place]. Exercise - treadmill, elliptical.  [employer].  Divorced, no significant other, lives with grand-daughter.  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  Wants to work with women's counseling.  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  02/2020   Social Determinants of Health   Financial Resource Strain:   . Difficulty of Paying Living Expenses: Not on file  Food Insecurity:   . Worried About Charity fundraiser in the Last Year: Not on file  . Ran Out of Food in the Last Year: Not on file  Transportation Needs:   . Lack of Transportation (Medical): Not on file  . Lack of Transportation (Non-Medical): Not on file  Physical Activity:   .  Days of Exercise per Week: Not on file  . Minutes of Exercise per Session: Not on file  Stress:   . Feeling of Stress : Not on file  Social Connections:   . Frequency of Communication with Friends and Family: Not on file  . Frequency of Social Gatherings with Friends and Family: Not on file  . Attends Religious Services: Not on file  . Active Member of Clubs or Organizations: Not on file  . Attends Archivist Meetings: Not on file  . Marital Status: Not on file  Intimate Partner Violence:   . Fear of Current or Ex-Partner: Not on file  . Emotionally Abused: Not on file  . Physically Abused: Not on file  . Sexually Abused: Not on file    Family History  Problem Relation Age of Onset  . Diabetes Mother   . Hyperlipidemia Mother   . Eclampsia Mother   .  Seizures Mother        died of seizure  . Asthma Sister   . Hypertension Sister   . Diabetes Sister   . Cancer Maternal Grandmother   . Hypertension Maternal Grandmother   . Hypertension Father   . Gout Father   . Hypertension Maternal Grandfather   . Hypertension Paternal Grandmother   . Heart disease Neg Hx      Current Outpatient Medications:  .  cetirizine (ZYRTEC) 10 MG tablet, Take 1 tablet (10 mg total) by mouth at bedtime., Disp: 30 tablet, Rfl: 2 .  diphenhydramine-acetaminophen (TYLENOL PM) 25-500 MG TABS, Take 1 tablet by mouth at bedtime as needed. Reported on 03/14/2016, Disp: , Rfl:  .  Multiple Vitamins-Minerals (MULTIVITAMIN WITH MINERALS) tablet, Take 1 tablet by mouth daily.  , Disp: , Rfl:   Allergies  Allergen Reactions  . Aspirin Hives    hives  . Shellfish Allergy Hives    Review of Systems Constitutional: -fever, -chills, -sweats, -unexpected weight change, -decreased appetite, -fatigue Allergy: -sneezing, -itching, -congestion Dermatology: -changing moles, --rash, -lumps ENT: -runny nose, -ear pain, -sore throat, -hoarseness, -sinus pain, -teeth pain, - ringing in ears, -hearing loss, -nosebleeds Cardiology: -chest pain, -palpitations, -swelling, -difficulty breathing when lying flat, -waking up short of breath Respiratory: -cough, -shortness of breath, -difficulty breathing with exercise or exertion, -wheezing, -coughing up blood Gastroenterology: -abdominal pain, -nausea, -vomiting, -diarrhea, -constipation, -blood in stool, -changes in bowel movement, -difficulty swallowing or eating Hematology: -bleeding, -bruising  Musculoskeletal: +joint aches, -muscle aches, -joint swelling, -back pain, -neck pain, -cramping, -changes in gait Ophthalmology: denies vision changes, eye redness, itching, discharge Urology: -burning with urination, -difficulty urinating, -blood in urine, -urinary frequency, -urgency, -incontinence Neurology: -headache, -weakness,  -tingling, -numbness, -memory loss, -falls, -dizziness Psychology: -depressed mood, -agitation, -sleep problems     Objective:   Physical Exam  BP 130/70   Pulse 71   Temp 98.7 F (37.1 C)   Ht 5\' 4"  (1.626 m)   Wt 200 lb 3.2 oz (90.8 kg)   SpO2 98%   BMI 34.36 kg/m   Wt Readings from Last 3 Encounters:  02/15/20 200 lb 3.2 oz (90.8 kg)  01/06/20 196 lb 3.2 oz (89 kg)  12/30/19 196 lb (88.9 kg)   BP Readings from Last 3 Encounters:  02/15/20 130/70  01/06/20 118/76  12/30/19 120/72    General appearance: alert, no distress, WD/WN, pleasant AA female Skin: few scattered macules, no worrisome lesions Neck: supple, no lymphadenopathy, no thyromegaly, no masses, normal ROM, no bruits Chest: non tender, normal shape and  expansion Heart: RRR, normal S1, S2, no murmurs Lungs: CTA bilaterally, no wheezes, rhonchi, or rales Abdomen: +bs, soft, few surgical port scars, non tender, non distended, no masses, no hepatomegaly, no splenomegaly, no bruits Back: non tender, normal ROM, no scoliosis Musculoskeletal: mild pain reported in left humerus and AC joint left with palpation, but no pain with ROM which is full, there is excess fatty tissue and skin of both upper arms, otherwise  upper extremities non tender, no obvious deformity, normal ROM throughout, lower extremities non tender, no obvious deformity, normal ROM throughout Extremities: spider veins and mild varicosities of bilat proximal lower legs, no edema, no cyanosis, no clubbing Pulses: 2+ symmetric, upper and lower extremities, normal cap refill Neurological: alert, oriented x 3, CN2-12 intact, strength normal upper extremities and lower extremities, sensation normal throughout, DTRs 2+ throughout, no cerebellar signs, gait normal Psychiatric: normal affect, behavior normal, pleasant  Breast/gyn/rectal - deferred to gynecology  EKG Indication screen for heart disease, physical, rate 57 bpm, PR 162 ms, QRS 96 ms, QTC 397 ms,  axis 56 degrees, sinus bradycardia   Assessment and Plan :    Encounter Diagnoses  Name Primary?  . Encounter for health maintenance examination in adult Yes  . Class 1 obesity without serious comorbidity in adult, unspecified BMI, unspecified obesity type   . History of uterine fibroid   . Allergic rhinitis due to pollen, unspecified seasonality   . Hidradenitis axillaris   . Vaccine counseling   . Screening for heart disease   . Encounter for hepatitis C screening test for low risk patient   . Screen for STD (sexually transmitted disease)   . Left arm pain   . Chronic left shoulder pain   . Mild intermittent asthma, unspecified whether complicated    Physical exam - discussed healthy lifestyle, diet, exercise, preventative care, vaccinations, and addressed their concerns.   We will request recent pap, mammogram and colonoscopy from specialist.  All updated 2020.    Obesity - advised weight loss goals.  She is exercising and eating healthy  Vaccines: She gets covid vaccine this week.  Advised shingrix and Td vaccines about 2 months from now after getting both covid doses.  Left arm pain unclear etiology-go for x-ray  STD screen today.  She has had a new partner in the last year and agrees to screening.  Mild asthma-no recent flares.  Discussed inhaler use, symptoms that would prompt recheck for worse asthma  History of hidradenitis axillaris-doing okay since last visit, gets occasional flareup  Follow-up pending labs  [place] was seen today for annual exam.  Diagnoses and all orders for this visit:  Encounter for health maintenance examination in adult -     Comprehensive metabolic panel -     CBC -     EKG 12-Lead -     Lipid panel -     HIV Antibody (routine testing w rflx) -     Hepatitis C antibody -     GC/Chlamydia Probe Amp -     RPR  Class 1 obesity without serious comorbidity in adult, unspecified BMI, unspecified obesity type  History of uterine  fibroid  Allergic rhinitis due to pollen, unspecified seasonality  Hidradenitis axillaris  Vaccine counseling  Screening for heart disease -     EKG 12-Lead  Encounter for hepatitis C screening test for low risk patient -     Hepatitis C antibody  Screen for STD (sexually transmitted disease) -     HIV  Antibody (routine testing w rflx) -     Hepatitis C antibody -     GC/Chlamydia Probe Amp  Left arm pain -     DG Humerus Left; Future -     DG Shoulder Left; Future  Chronic left shoulder pain -     DG Humerus Left; Future -     DG Shoulder Left; Future  Mild intermittent asthma, unspecified whether complicated

## 2020-02-15 NOTE — Addendum Note (Signed)
Addended by: Edgar Frisk on: 02/15/2020 01:48 PM   Modules accepted: Orders

## 2020-02-15 NOTE — Patient Instructions (Signed)
Please go to [facility] for your left arm xray.   Their hours are 8am - 4:30 pm Monday - Friday.  Take your insurance card with you.  [facility] [phone]  [address], [place], [place] [address]  [address] [place], [place] [address]

## 2020-02-16 LAB — COMPREHENSIVE METABOLIC PANEL
ALT: 15 IU/L (ref 0–32)
AST: 20 IU/L (ref 0–40)
Albumin/Globulin Ratio: 1.3 (ref 1.2–2.2)
Albumin: 4 g/dL (ref 3.8–4.8)
Alkaline Phosphatase: 60 IU/L (ref 39–117)
BUN/Creatinine Ratio: 14 (ref 12–28)
BUN: 11 mg/dL (ref 8–27)
Bilirubin Total: 0.4 mg/dL (ref 0.0–1.2)
CO2: 23 mmol/L (ref 20–29)
Calcium: 9.4 mg/dL (ref 8.7–10.3)
Chloride: 107 mmol/L — ABNORMAL HIGH (ref 96–106)
Creatinine, Ser: 0.76 mg/dL (ref 0.57–1.00)
GFR calc Af Amer: 98 mL/min/{1.73_m2} (ref >59)
GFR calc non Af Amer: 85 mL/min/{1.73_m2} (ref >59)
Globulin, Total: 3 g/dL (ref 1.5–4.5)
Glucose: 88 mg/dL (ref 65–99)
Potassium: 4.1 mmol/L (ref 3.5–5.2)
Sodium: 144 mmol/L (ref 134–144)
Total Protein: 7 g/dL (ref 6.0–8.5)

## 2020-02-16 LAB — GC/CHLAMYDIA PROBE AMP
Chlamydia trachomatis, NAA: NEGATIVE
Neisseria Gonorrhoeae by PCR: NEGATIVE

## 2020-02-16 LAB — CBC
Hematocrit: 43.3 % (ref 34.0–46.6)
Hemoglobin: 13.8 g/dL (ref 11.1–15.9)
MCH: 27.7 pg (ref 26.6–33.0)
MCHC: 31.9 g/dL (ref 31.5–35.7)
MCV: 87 fL (ref 79–97)
Platelets: 222 10*3/uL (ref 150–450)
RBC: 4.99 x10E6/uL (ref 3.77–5.28)
RDW: 13.2 % (ref 11.7–15.4)
WBC: 7.9 10*3/uL (ref 3.4–10.8)

## 2020-02-16 LAB — RPR: RPR Ser Ql: NONREACTIVE

## 2020-02-16 LAB — LIPID PANEL
Chol/HDL Ratio: 2.8 ratio (ref 0.0–4.4)
Cholesterol, Total: 174 mg/dL (ref 100–199)
HDL: 62 mg/dL (ref >39)
LDL Chol Calc (NIH): 100 mg/dL — ABNORMAL HIGH (ref 0–99)
Triglycerides: 64 mg/dL (ref 0–149)
VLDL Cholesterol Cal: 12 mg/dL (ref 5–40)

## 2020-02-16 LAB — HIV ANTIBODY (ROUTINE TESTING W REFLEX): HIV Screen 4th Generation wRfx: NONREACTIVE

## 2020-02-16 LAB — HEPATITIS C ANTIBODY: Hep C Virus Ab: <0.1 s/co ratio (ref 0.0–0.9)

## 2020-02-17 ENCOUNTER — Ambulatory Visit: Payer: BC Managed Care – PPO | Attending: Internal Medicine

## 2020-02-17 DIAGNOSIS — Z23 Encounter for immunization: Secondary | ICD-10-CM

## 2020-02-17 NOTE — Progress Notes (Signed)
   Covid-19 Vaccination Clinic  Name:  [name]    MRN: [id number] DOB: [birth year]  02/17/2020  [name] was observed post Covid-19 immunization for 30 minutes based on pre-vaccination screening without incident. She was provided with Vaccine Information Sheet and instruction to access the V-Safe system.   [name] was instructed to call 911 with any severe reactions post vaccine: Marland Kitchen Difficulty breathing  . Swelling of face and throat  . A fast heartbeat  . A bad rash all over body  . Dizziness and weakness   Immunizations Administered    Name Date Dose VIS Date Route   Pfizer COVID-19 Vaccine 02/17/2020  9:42 AM 0.3 mL 11/19/2019 Intramuscular   Manufacturer: [place]   Lot: [id number]   [place]: [id number]

## 2020-02-18 ENCOUNTER — Telehealth: Payer: Self-pay | Admitting: Medical

## 2020-02-18 NOTE — Telephone Encounter (Signed)
Received requested records from [facility] 

## 2020-02-22 ENCOUNTER — Encounter: Payer: Self-pay | Admitting: Medical

## 2020-03-13 ENCOUNTER — Ambulatory Visit: Payer: BC Managed Care – PPO | Attending: Internal Medicine

## 2020-03-13 DIAGNOSIS — Z23 Encounter for immunization: Secondary | ICD-10-CM

## 2020-03-13 NOTE — Progress Notes (Signed)
   Covid-19 Vaccination Clinic  Name:  ASTARIA AUTREY    MRN: [id number] DOB: 07-03-1961  03/13/2020  Ms. Kinzler was observed post Covid-19 immunization for 30 minutes based on pre-vaccination screening without incident. She was provided with Vaccine Information Sheet and instruction to access the V-Safe system.   Ms. Pacifico was instructed to call 911 with any severe reactions post vaccine: Marland Kitchen Difficulty breathing  . Swelling of face and throat  . A fast heartbeat  . A bad rash all over body  . Dizziness and weakness   Immunizations Administered    Name Date Dose VIS Date Route   Pfizer COVID-19 Vaccine 03/13/2020  5:06 PM 0.3 mL 11/19/2019 Intramuscular   Manufacturer: [place]   Lot: [id number]   [place]: [id number]

## 2020-03-28 ENCOUNTER — Encounter: Payer: Self-pay | Admitting: Medical

## 2020-03-28 ENCOUNTER — Telehealth: Payer: BC Managed Care – PPO | Admitting: Medical

## 2020-03-28 ENCOUNTER — Other Ambulatory Visit: Payer: Self-pay

## 2020-03-28 VITALS — Ht 64.0 in | Wt 200.0 lb

## 2020-03-28 DIAGNOSIS — R110 Nausea: Secondary | ICD-10-CM

## 2020-03-28 DIAGNOSIS — R42 Dizziness and giddiness: Secondary | ICD-10-CM | POA: Diagnosis not present

## 2020-03-28 DIAGNOSIS — L732 Hidradenitis suppurativa: Secondary | ICD-10-CM

## 2020-03-28 DIAGNOSIS — R195 Other fecal abnormalities: Secondary | ICD-10-CM | POA: Diagnosis not present

## 2020-03-28 MED ORDER — ONDANSETRON HCL 4 MG PO TABS: 4.0000 mg | ORAL_TABLET | Freq: Three times a day (TID) | ORAL | 0 refills | Status: DC | PRN

## 2020-03-28 MED ORDER — SULFAMETHOXAZOLE-TRIMETHOPRIM 800-160 MG PO TABS: 1.0000 | ORAL_TABLET | Freq: Two times a day (BID) | ORAL | 0 refills | Status: DC

## 2020-03-28 NOTE — Progress Notes (Signed)
Subjective:     Patient ID: [name], female   DOB: 1961-01-26, 59 y.o.   MRN: [id number]  This visit type was conducted due to national recommendations for restrictions regarding the COVID-19 Pandemic (e.g. social distancing) in an effort to limit this patient's exposure and mitigate transmission in our community.  Due to their co-morbid illnesses, this patient is at least at moderate risk for complications without adequate follow up.  This format is felt to be most appropriate for this patient at this time.    Documentation for virtual audio and video telecommunications through Zoom encounter:  The patient was located at home. The provider was located in the office. The patient did consent to this visit and is aware of possible charges through their insurance for this visit.  The other persons participating in this telemedicine service were none. Time spent on call was 20 minutes and in review of previous records 20 minutes total.  This virtual service is not related to other E/M service within previous 7 days.   HPI Chief Complaint  Patient presents with  . Headache    dizziness and nausea    Virtual consult for 3-4 day hx/o nausea, dizziness feeling, feels like balance is off.  Having diarrhea x 1 day.   Thought she had some food poisoning possibly.    Her and her granddaughter felt a little sick after eating chinese food this weekend.  She denies concern for UTI or urinary c/o.    Denies severe headache, no slurred speech, no confusion. She also has another boil under arm x 2-3 days that is painful and swollen like the abscessed one she came in for a few months ago.  Wonders if the infection is making her feel bad.  No body aches, no chills.   No fever.  Her gynecologist put her on antibiotics over a month ago for vaginal concern.    She had her covid vaccine last months.   No other aggravating or relieving factors. No other complaint.  Past Medical History:  Diagnosis  Date  . Allergy   . Anemia   . History of uterine fibroid   . Immune to hepatitis B 03/20/2001  . Immune to mumps 01/17/2006  . Immune to rubella 12/10/1983  . Obesity   . Reactive airway disease   . Wears dentures    partial  . Wears glasses    Current Outpatient Medications on File Prior to Visit  Medication Sig Dispense Refill  . cetirizine (ZYRTEC) 10 MG tablet Take 1 tablet (10 mg total) by mouth at bedtime. 30 tablet 2  . diphenhydramine-acetaminophen (TYLENOL PM) 25-500 MG TABS Take 1 tablet by mouth at bedtime as needed. Reported on 03/14/2016    . Multiple Vitamins-Minerals (MULTIVITAMIN WITH MINERALS) tablet Take 1 tablet by mouth daily.       No current facility-administered medications on file prior to visit.    Review of Systems As in subjective    Objective:   Physical Exam Due to coronavirus pandemic stay at home measures, patient visit was virtual and they were not examined in person.        Assessment:     Encounter Diagnoses  Name Primary?  . Nausea Yes  . Dizziness   . Loose stools   . Hidradenitis axillaris        Plan:     Discussed limitations of virtual consult.  Discussed her concerns, possible differential including viral syndrome, boil/hydradenitis suppurative, UTI, food poisoning or other.  Begin bactrim for possible early abscess/hidradenitis suppurative.    Can use Zofran for nausea, rest, hydrate well.   If worse in the next few days, consider in person visit and eval.   If she continues to get hidradenitis issues, may need to see dermatology.  Can use warm compresses for now.    [place] was seen today for headache.  Diagnoses and all orders for this visit:  Nausea  Dizziness  Loose stools  Hidradenitis axillaris  Other orders -     ondansetron (ZOFRAN) 4 MG tablet; Take 1 tablet (4 mg total) by mouth every 8 (eight) hours as needed for nausea or vomiting. -     sulfamethoxazole-trimethoprim (BACTRIM DS) 800-160 MG tablet; Take  1 tablet by mouth 2 (two) times daily.  f/u prn

## 2020-04-06 ENCOUNTER — Ambulatory Visit (INDEPENDENT_AMBULATORY_CARE_PROVIDER_SITE_OTHER): Payer: Self-pay | Admitting: Plastic Surgery

## 2020-04-06 ENCOUNTER — Encounter: Payer: Self-pay | Admitting: Plastic Surgery

## 2020-04-06 ENCOUNTER — Other Ambulatory Visit: Payer: Self-pay

## 2020-04-06 VITALS — BP 111/74 | HR 70 | Temp 98.0°F | Ht 64.0 in | Wt 200.0 lb

## 2020-04-06 DIAGNOSIS — Z411 Encounter for cosmetic surgery: Secondary | ICD-10-CM

## 2020-04-06 NOTE — Progress Notes (Signed)
Patient is here for bilateral ear piercing.  She had earlobe repairs done several months ago and is now ready to have it repierced.  I cleansed both areas with an alcohol pad.  Identified what appeared to be an appropriate location for the piercing on each side and marked with a marking pen.  I confirmed with her that these were the locations that were suitable for her.  Earrings were then placed in both locations without issue.  She handled it great.  We will see her again as needed.

## 2020-04-07 ENCOUNTER — Encounter: Payer: Self-pay | Admitting: Medical

## 2020-04-17 ENCOUNTER — Encounter: Payer: Self-pay | Admitting: Medical

## 2020-04-21 ENCOUNTER — Other Ambulatory Visit: Payer: Self-pay | Admitting: Medical

## 2020-05-30 ENCOUNTER — Other Ambulatory Visit: Payer: Self-pay | Admitting: Obstetrics and Gynecology

## 2020-05-31 ENCOUNTER — Telehealth: Payer: Self-pay | Admitting: Medical

## 2020-05-31 DIAGNOSIS — L732 Hidradenitis suppurativa: Secondary | ICD-10-CM

## 2020-05-31 NOTE — Telephone Encounter (Signed)
Refer to dermatology, RE: hidradenitis supportive

## 2020-05-31 NOTE — Telephone Encounter (Signed)
Pt called and said at her last visit she was told if her boils kept coming back she could get a referral to a dermatologist. Pt wants to see if she can go ahead and get that referral or did she need to come back in for a visit?

## 2020-05-31 NOTE — Telephone Encounter (Signed)
Referral was faxed

## 2020-06-13 ENCOUNTER — Other Ambulatory Visit (INDEPENDENT_AMBULATORY_CARE_PROVIDER_SITE_OTHER): Payer: BC Managed Care – PPO

## 2020-06-13 ENCOUNTER — Other Ambulatory Visit: Payer: Self-pay

## 2020-06-13 DIAGNOSIS — Z23 Encounter for immunization: Secondary | ICD-10-CM

## 2020-06-22 LAB — HM PAP SMEAR: HM Pap smear: NEGATIVE

## 2020-08-21 ENCOUNTER — Other Ambulatory Visit (INDEPENDENT_AMBULATORY_CARE_PROVIDER_SITE_OTHER): Payer: BC Managed Care – PPO

## 2020-08-21 ENCOUNTER — Other Ambulatory Visit: Payer: Self-pay

## 2020-08-21 DIAGNOSIS — Z23 Encounter for immunization: Secondary | ICD-10-CM | POA: Diagnosis not present

## 2020-10-16 ENCOUNTER — Other Ambulatory Visit: Payer: Self-pay | Admitting: Obstetrics and Gynecology

## 2020-11-06 ENCOUNTER — Other Ambulatory Visit: Payer: Self-pay

## 2020-11-06 ENCOUNTER — Ambulatory Visit: Payer: BC Managed Care – PPO | Attending: Internal Medicine

## 2020-11-06 DIAGNOSIS — Z23 Encounter for immunization: Secondary | ICD-10-CM

## 2020-11-06 NOTE — Progress Notes (Signed)
° °  Covid-19 Vaccination Clinic  Name:  NAIJA TROOST    MRN: [id number] DOB: 1961/10/09  11/06/2020  Ms. Macera was observed post Covid-19 immunization for 15 minutes without incident. She was provided with Vaccine Information Sheet and instruction to access the V-Safe system.   Ms. Penn was instructed to call 911 with any severe reactions post vaccine:  Difficulty breathing   Swelling of face and throat   A fast heartbeat   A bad rash all over body   Dizziness and weakness   Immunizations Administered    Name Date Dose VIS Date Route   Pfizer COVID-19 Vaccine 11/06/2020  3:48 PM 0.3 mL 09/27/2020 Intramuscular   Manufacturer: [place]   Lot: [id number]   [place]: [id number]

## 2021-01-19 ENCOUNTER — Telehealth: Payer: Self-pay | Admitting: Medical

## 2021-01-19 NOTE — Telephone Encounter (Signed)
Verify last tetanus and she needs to call and make sure insurance covers this

## 2021-01-19 NOTE — Telephone Encounter (Signed)
Pt called and wanted to schedule a TDAP appt. Please advise pt at [phone].

## 2021-01-22 NOTE — Telephone Encounter (Signed)
It does not look like patient has had tdap in years.

## 2021-01-22 NOTE — Telephone Encounter (Signed)
Have her check insurance for coverage but yes she can come in for tetanus diphtheria booster/Td  Schedule yearly physical coming up in March

## 2021-01-22 NOTE — Telephone Encounter (Signed)
Patient informed to check with insurance about Tetanus.

## 2021-02-15 ENCOUNTER — Encounter: Payer: Self-pay | Admitting: Medical

## 2021-02-15 ENCOUNTER — Ambulatory Visit: Payer: BC Managed Care – PPO | Admitting: Medical

## 2021-02-15 ENCOUNTER — Other Ambulatory Visit: Payer: Self-pay

## 2021-02-15 VITALS — BP 104/70 | HR 62 | Ht 64.0 in | Wt 196.0 lb

## 2021-02-15 DIAGNOSIS — Z0000 Encounter for general adult medical examination without abnormal findings: Secondary | ICD-10-CM

## 2021-02-15 DIAGNOSIS — E2839 Other primary ovarian failure: Secondary | ICD-10-CM

## 2021-02-15 DIAGNOSIS — Z780 Asymptomatic menopausal state: Secondary | ICD-10-CM

## 2021-02-15 DIAGNOSIS — Z1329 Encounter for screening for other suspected endocrine disorder: Secondary | ICD-10-CM | POA: Insufficient documentation

## 2021-02-15 DIAGNOSIS — Z13220 Encounter for screening for lipoid disorders: Secondary | ICD-10-CM

## 2021-02-15 DIAGNOSIS — Z7185 Encounter for immunization safety counseling: Secondary | ICD-10-CM | POA: Diagnosis not present

## 2021-02-15 DIAGNOSIS — J301 Allergic rhinitis due to pollen: Secondary | ICD-10-CM | POA: Diagnosis not present

## 2021-02-15 DIAGNOSIS — J4541 Moderate persistent asthma with (acute) exacerbation: Secondary | ICD-10-CM

## 2021-02-15 DIAGNOSIS — E569 Vitamin deficiency, unspecified: Secondary | ICD-10-CM

## 2021-02-15 DIAGNOSIS — Z86018 Personal history of other benign neoplasm: Secondary | ICD-10-CM

## 2021-02-15 DIAGNOSIS — L732 Hidradenitis suppurativa: Secondary | ICD-10-CM

## 2021-02-15 NOTE — Patient Instructions (Signed)
Today you had a preventative care visit or wellness visit.    Topics today may have included healthy lifestyle, diet, exercise, preventative care, vaccinations, sick and well care, proper use of emergency dept and after hours care, as well as other concerns.     Recommendations: Continue to return yearly for your annual wellness and preventative care visits.  This gives [place] a chance to discuss healthy lifestyle, exercise, vaccinations, review your chart record, and perform screenings where appropriate.  I recommend you see your eye doctor yearly for routine vision care.  I recommend you see your dentist yearly for routine dental care including hygiene visits twice yearly.  See your gynecologist yearly for routine gynecological care.    Vaccination recommendations were reviewed You are up to date on Shingrix vaccine You are up to date on Covid vaccine You are up to date on flu shot You are DUE for tetanus booster You are DUE for pneumococcal vaccine  Check insurance coverage for pneumococcal vaccine  We will request vaccine records from employee health as you may be up to date on tetanus   Screening for cancer: Breast cancer screening: You should perform a self breast exam monthly.   We reviewed recommendations for regular mammograms and breast cancer screening. We will request recent mammogram from gynecology   Colon cancer screening:  I reviewed your colonoscopy on file that is up to date from 2020   Cervical cancer screening: We reviewed recommendations for pap smear screening.  We will request recent pap smear from gynecology  Skin cancer screening: Check your skin regularly for new changes, growing lesions, or other lesions of concern Come in for evaluation if you have skin lesions of concern.  Lung cancer screening: If you have a greater than 30 pack year history of tobacco use, then you qualify for lung cancer screening with a chest CT scan  We currently don't have  screenings for other cancers besides breast, cervical, colon, and lung cancers.  If you have a strong family history of cancer or have other cancer screening concerns, please let me know.    Bone health: Get at least 150 minutes of aerobic exercise weekly Get weight bearing exercise at least once weekly You are due for bone density test.  Please call to schedule your bone density test   [facility] [address] [place], [place] [address]     Heart health: Get at least 150 minutes of aerobic exercise weekly Limit alcohol It is important to maintain a healthy blood pressure and healthy cholesterol numbers   Separate significant issues discussed: Asthma - consider daily allergy pill such as zyrtec now through May for spring allergies which can trigger asthma.  Use albuterol inhaler as needed, 1-2 puffs every 4-6 hours as needed.  If you start having lots of asthma symptoms over the next few weeks, we may need to consider preventative inhaler.    BMI >30 - work on efforts to lose weight.   Start getting some weight bearing exercise in addition to your pool aerobics  Hidradenitis - doing fine.  Last flare last year cleared with clindamycin lotion.   Saw plastic surgery and dermatology last year for this.

## 2021-02-15 NOTE — Progress Notes (Signed)
Subjective:   HPI  [name] is a 60 y.o. female who presents for Chief Complaint  Patient presents with  . Annual Exam    Physical with fasting labs- due for tetanus     Patient Care Team: Agusta Hackenberg, Camelia Eng, PA-C as PCP - General (Family Medicine)  Medical care team includes:  Dr. Fayne Mediate, gynecology  Dr. Archie Balboa, dentist  Dr. Delman Cheadle, ophthalmology  Dr. Carol Ada, GI  Dr. Maureen Ralphs, [facility]  Dr. Joslyn Hy, oral and maxillofacial surgery  Dr. Myna Hidalgo, Prosthodontics  Dr. Mingo Amber, plastic surgery consult  Gloris [place], [place], dermatology Allyson Sabal   Concerns: hidradenitis - saw [facility] last year, cleared up with Clindamycin lotion in armpit areas.   Asthma - no recent problems.  Knee pains - does water aerobics 3 times per week, sees ortho, Dr. 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  Reviewed their medical, surgical, family, social, medication, and allergy history and updated chart as appropriate.  Past Medical History:  Diagnosis Date  . Allergy   . Anemia   . History of uterine fibroid   . Immune to hepatitis B 03/20/2001  . Immune to mumps 01/17/2006  . Immune to rubella 12/10/1983  . Obesity   . Reactive airway disease   . Wears dentures    partial  . Wears glasses     Family History  Problem Relation Age of Onset  . Diabetes Mother   . Hyperlipidemia Mother   . Eclampsia Mother   . Seizures Mother        died of seizure  . Asthma Sister   . Hypertension Sister   . Diabetes Sister   . Cancer Maternal Grandmother   . Hypertension Maternal Grandmother   . Hypertension Father   . Gout Father   . Hypertension Maternal Grandfather   . Hypertension Paternal Grandmother   . Heart disease Neg Hx      Current Outpatient Medications:  .  docusate sodium (COLACE) 100 MG capsule, Take 200 mg by mouth 2 (two) times  daily., Disp: , Rfl:  .  Multiple Vitamins-Minerals (MULTIVITAMIN WITH MINERALS) tablet, Take 1 tablet by mouth daily., Disp: , Rfl:   Allergies  Allergen Reactions  . Aspirin Hives    hives  . Shellfish Allergy Hives      Review of Systems Constitutional: -fever, -chills, -sweats, -unexpected weight change, -decreased appetite, -fatigue Allergy: -sneezing, -itching, -congestion Dermatology: -changing moles, --rash, -lumps ENT: -runny nose, -ear pain, -sore throat, -hoarseness, -sinus pain, -teeth pain, - ringing in ears, -hearing loss, -nosebleeds Cardiology: -chest pain, -palpitations, -swelling, -difficulty breathing when lying flat, -waking up short of breath Respiratory: -cough, -shortness of breath, -difficulty breathing with exercise or exertion, -wheezing, -coughing up blood Gastroenterology: -abdominal pain, -nausea, -vomiting, -diarrhea, -constipation, -blood in stool, -changes in bowel movement, -difficulty swallowing or eating Hematology: -bleeding, -bruising  Musculoskeletal: -joint aches, -muscle aches, -joint swelling, -back pain, -neck pain, -cramping, -changes in gait Ophthalmology: denies vision changes, eye redness, itching, discharge Urology: -burning with urination, -difficulty urinating, -blood in urine, -urinary frequency, -urgency, -incontinence Neurology: -headache, -weakness, -tingling, -numbness, -memory loss, -falls, -dizziness Psychology: -depressed mood, -agitation, -sleep problems Breast/gyn: -breast tendnerss, -discharge, -lumps, -vaginal discharge,- irregular periods, -heavy periods     Objective:  BP 104/70   Pulse 62   Ht 5\' 4"  (1.626 m)   Wt 196 lb (88.9 kg)   SpO2  97%   BMI 33.64 kg/m   General appearance: alert, no distress, WD/WN, African American female Skin: unremarkable HEENT: normocephalic, conjunctiva/corneas normal, sclerae anicteric, PERRLA, EOMi, nares patent, no discharge or erythema, pharynx normal Oral cavity: MMM, tongue  normal, teeth normal Neck: supple, no lymphadenopathy, no thyromegaly, no masses, normal ROM, no bruits Chest: non tender, normal shape and expansion Heart: RRR, normal S1, S2, no murmurs Lungs: CTA bilaterally, no wheezes, rhonchi, or rales Abdomen: +bs, soft, non tender, non distended, no masses, no hepatomegaly, no splenomegaly, no bruits Back: non tender, normal ROM, no scoliosis Musculoskeletal: upper extremities non tender, no obvious deformity, normal ROM throughout, lower extremities non tender, no obvious deformity, normal ROM throughout Extremities: no edema, no cyanosis, no clubbing Pulses: 2+ symmetric, upper and lower extremities, normal cap refill Neurological: alert, oriented x 3, CN2-12 intact, strength normal upper extremities and lower extremities, sensation normal throughout, DTRs 2+ throughout, no cerebellar signs, gait normal Psychiatric: normal affect, behavior normal, pleasant  Breast/gyn/rectal - deferred to gynecology     Assessment and Plan :   Encounter Diagnoses  Name Primary?  . Encounter for health maintenance examination in adult Yes  . Vaccine counseling   . Allergic rhinitis due to pollen, unspecified seasonality   . 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 Hidradenitis axillaris   . History of uterine fibroid   . Screening for thyroid disorder   . Vitamin deficiency   . Screening for lipid disorders   . Post-menopausal   . Estrogen deficiency     Today you had a preventative care visit or wellness visit.    Topics today may have included healthy lifestyle, diet, exercise, preventative care, vaccinations, sick and well care, proper use of emergency dept and after hours care, as well as other concerns.     Recommendations: Continue to return yearly for your annual wellness and preventative care visits.  This gives [place] a chance to discuss healthy lifestyle, exercise, vaccinations, review your chart record, and perform  screenings where appropriate.  I recommend you see your eye doctor yearly for routine vision care.  I recommend you see your dentist yearly for routine dental care including hygiene visits twice yearly.  See your gynecologist yearly for routine gynecological care.    Vaccination recommendations were reviewed You are up to date on Shingrix vaccine You are up to date on Covid vaccine You are up to date on flu shot You are DUE for tetanus booster You are DUE for pneumococcal vaccine  Check insurance coverage for pneumococcal vaccine  We will request vaccine records from employee health as you may be up to date on tetanus   Screening for cancer: Breast cancer screening: You should perform a self breast exam monthly.   We reviewed recommendations for regular mammograms and breast cancer screening. We will request recent mammogram from gynecology   Colon cancer screening:  I reviewed your colonoscopy on file that is up to date from 2020   Cervical cancer screening: We reviewed recommendations for pap smear screening.  We will request recent pap smear from gynecology  Skin cancer screening: Check your skin regularly for new changes, growing lesions, or other lesions of concern Come in for evaluation if you have skin lesions of concern.  Lung cancer screening: If you have a greater than 30 pack year history of tobacco use, then you qualify for lung cancer screening with a chest CT scan  We currently don't have screenings for other cancers besides breast,  cervical, colon, and lung cancers.  If you have a strong family history of cancer or have other cancer screening concerns, please let me know.    Bone health: Get at least 150 minutes of aerobic exercise weekly Get weight bearing exercise at least once weekly You are due for bone density test.  Please call to schedule your bone density test   [facility] [address]  [place], [place] [address]     Heart health: Get at least 150 minutes of aerobic exercise weekly Limit alcohol It is important to maintain a healthy blood pressure and healthy cholesterol numbers   Separate significant issues discussed: Asthma - consider daily allergy pill such as zyrtec now through May for spring allergies which can trigger asthma.  Use albuterol inhaler as needed, 1-2 puffs every 4-6 hours as needed.  If you start having lots of asthma symptoms over the next few weeks, we may need to consider preventative inhaler.    BMI >30 - work on efforts to lose weight.   Start getting some weight bearing exercise in addition to your pool aerobics  Hidradenitis - doing fine.  Last flare last year cleared with clindamycin lotion.   Saw plastic surgery and dermatology last year for this.    Alicyn was seen today for annual exam.  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

## 2021-02-16 LAB — CBC WITH DIFFERENTIAL/PLATELET
Basophils Absolute: 0 10*3/uL (ref 0.0–0.2)
Basos: 1 %
EOS (ABSOLUTE): 0.3 10*3/uL (ref 0.0–0.4)
Eos: 4 %
Hematocrit: 42.5 % (ref 34.0–46.6)
Hemoglobin: 13.9 g/dL (ref 11.1–15.9)
Immature Grans (Abs): 0 10*3/uL (ref 0.0–0.1)
Immature Granulocytes: 0 %
Lymphocytes Absolute: 2.6 10*3/uL (ref 0.7–3.1)
Lymphs: 33 %
MCH: 27.9 pg (ref 26.6–33.0)
MCHC: 32.7 g/dL (ref 31.5–35.7)
MCV: 85 fL (ref 79–97)
Monocytes Absolute: 0.7 10*3/uL (ref 0.1–0.9)
Monocytes: 8 %
Neutrophils Absolute: 4.3 10*3/uL (ref 1.4–7.0)
Neutrophils: 54 %
Platelets: 224 10*3/uL (ref 150–450)
RBC: 4.98 x10E6/uL (ref 3.77–5.28)
RDW: 13.4 % (ref 11.7–15.4)
WBC: 8 10*3/uL (ref 3.4–10.8)

## 2021-02-16 LAB — TSH: TSH: 2.46 u[IU]/mL (ref 0.450–4.500)

## 2021-02-16 LAB — COMPREHENSIVE METABOLIC PANEL
ALT: 14 IU/L (ref 0–32)
AST: 21 IU/L (ref 0–40)
Albumin/Globulin Ratio: 1.3 (ref 1.2–2.2)
Albumin: 4.1 g/dL (ref 3.8–4.8)
Alkaline Phosphatase: 65 IU/L (ref 44–121)
BUN/Creatinine Ratio: 16 (ref 12–28)
BUN: 11 mg/dL (ref 8–27)
Bilirubin Total: 0.4 mg/dL (ref 0.0–1.2)
CO2: 21 mmol/L (ref 20–29)
Calcium: 9.9 mg/dL (ref 8.7–10.3)
Chloride: 104 mmol/L (ref 96–106)
Creatinine, Ser: 0.67 mg/dL (ref 0.57–1.00)
Globulin, Total: 3.1 g/dL (ref 1.5–4.5)
Glucose: 90 mg/dL (ref 65–99)
Potassium: 3.8 mmol/L (ref 3.5–5.2)
Sodium: 141 mmol/L (ref 134–144)
Total Protein: 7.2 g/dL (ref 6.0–8.5)
eGFR: 99 mL/min/{1.73_m2} (ref >59)

## 2021-02-16 LAB — LIPID PANEL
Chol/HDL Ratio: 2.6 ratio (ref 0.0–4.4)
Cholesterol, Total: 168 mg/dL (ref 100–199)
HDL: 64 mg/dL (ref >39)
LDL Chol Calc (NIH): 92 mg/dL (ref 0–99)
Triglycerides: 64 mg/dL (ref 0–149)
VLDL Cholesterol Cal: 12 mg/dL (ref 5–40)

## 2021-02-16 LAB — VITAMIN D 25 HYDROXY (VIT D DEFICIENCY, FRACTURES): Vit D, 25-Hydroxy: 44.3 ng/mL (ref 30.0–100.0)

## 2021-02-20 ENCOUNTER — Telehealth: Payer: Self-pay | Admitting: Internal Medicine

## 2021-02-20 NOTE — Telephone Encounter (Signed)
Pt called today seeing if her records from employee health as come over yet. She signed a records release last week when she was here.

## 2021-04-04 ENCOUNTER — Other Ambulatory Visit: Payer: BC Managed Care – PPO

## 2021-04-04 ENCOUNTER — Other Ambulatory Visit: Payer: Self-pay | Admitting: Medical

## 2021-04-04 DIAGNOSIS — Z780 Asymptomatic menopausal state: Secondary | ICD-10-CM

## 2021-04-04 DIAGNOSIS — Z0000 Encounter for general adult medical examination without abnormal findings: Secondary | ICD-10-CM

## 2021-04-04 DIAGNOSIS — E2839 Other primary ovarian failure: Secondary | ICD-10-CM

## 2021-04-23 ENCOUNTER — Telehealth: Payer: Self-pay | Admitting: Medical

## 2021-04-23 NOTE — Telephone Encounter (Signed)
I recommend the pneumococcal 23 vaccine.  This vaccine can be done every 5 years for people with asthma that are high risk for pneumonia and hospitalization due to pneumonia.  When she turns 64 we do the Prevnar 13 which is the other vaccine for pneumonia.

## 2021-04-23 NOTE — Telephone Encounter (Signed)
Pt is scheduled for Pneumonia shot next week per your request. Which shot did you want her to have the prevnar 13 or 20? She also wanted to know what's the difference between them

## 2021-05-02 ENCOUNTER — Telehealth: Payer: BC Managed Care – PPO | Admitting: Medical

## 2021-05-02 ENCOUNTER — Other Ambulatory Visit (INDEPENDENT_AMBULATORY_CARE_PROVIDER_SITE_OTHER): Payer: BC Managed Care – PPO

## 2021-05-02 ENCOUNTER — Other Ambulatory Visit: Payer: Self-pay

## 2021-05-02 VITALS — Ht 64.0 in | Wt 197.0 lb

## 2021-05-02 DIAGNOSIS — R110 Nausea: Secondary | ICD-10-CM | POA: Diagnosis not present

## 2021-05-02 DIAGNOSIS — Z23 Encounter for immunization: Secondary | ICD-10-CM

## 2021-05-02 DIAGNOSIS — R197 Diarrhea, unspecified: Secondary | ICD-10-CM | POA: Insufficient documentation

## 2021-05-02 DIAGNOSIS — R5383 Other fatigue: Secondary | ICD-10-CM | POA: Diagnosis not present

## 2021-05-02 MED ORDER — ONDANSETRON 4 MG PO TBDP: 4.0000 mg | ORAL_TABLET | Freq: Three times a day (TID) | ORAL | 0 refills | Status: DC | PRN

## 2021-05-02 NOTE — Progress Notes (Signed)
Subjective:     Patient ID: [name], female   DOB: [birth year], 60 y.o.   MRN: [id number]  This visit type was conducted due to national recommendations for restrictions regarding the COVID-19 Pandemic (e.g. social distancing) in an effort to limit this patient's exposure and mitigate transmission in our community.  Due to their co-morbid illnesses, this patient is at least at moderate risk for complications without adequate follow up.  This format is felt to be most appropriate for this patient at this time.    Documentation for virtual audio and video telecommunications through [place] encounter:  The patient was located at home. The provider was located in the office. The patient did consent to this visit and is aware of possible charges through their insurance for this visit.  The other persons participating in this telemedicine service were none. Time spent on call was 20 minutes and in review of previous records 20 minutes total.  This virtual service is not related to other E/M service within previous 7 days.   HPI Chief Complaint  Patient presents with  . Diarrhea   Virtual consult for diarrhea.  Feeling weak.  Diarrhea x 4 days, thought it may be a stomach bug.   anythign she eats is coming straight out.  Foul odor diarrhea.  Stomach cramping, turning.  Has had some chills.  Feels hungry, but afraid to eat.  Had vomiting the first day, but no vomiting since.   Has 5-6 loose stools daily, all runny.  No blood in stool.  Has felt feverish but no documented fever.  No back pain, no urinary concerns.    No cough, no runny nose, no sneezing,no sore throat.  Using ibuprofen for aches, tylenol alternating.  Tried some soup.  Using gingerale and Gatorade.  Using nothing for nausea now.  Pepto bismol made it worse.  No recent travel.  No new animal exposure.  No new medication changes.  No sick contacts.  No recent covid test.   No recent food that was undercooked or  questionable. No other aggravating or relieving factors. No other complaint.  Past Medical History:  Diagnosis Date  . Allergy   . Anemia   . History of uterine fibroid   . Immune to hepatitis B 03/20/2001  . Immune to mumps 01/17/2006  . Immune to rubella 12/10/1983  . Obesity   . Reactive airway disease   . Wears dentures    partial  . Wears glasses    Current Outpatient Medications on File Prior to Visit  Medication Sig Dispense Refill  . Multiple Vitamins-Minerals (MULTIVITAMIN WITH MINERALS) tablet Take 1 tablet by mouth daily.    Marland Kitchen docusate sodium (COLACE) 100 MG capsule Take 200 mg by mouth 2 (two) times daily. (Patient not taking: Reported on 05/02/2021)     No current facility-administered medications on file prior to visit.     Review of Systems As in subjective    Objective:   Physical Exam Due to coronavirus pandemic stay at home measures, patient visit was virtual and they were not examined in person.   Ht 5\' 4"  (1.626 m)   Wt 197 lb (89.4 kg)   BMI 33.81 kg/m   Wt Readings from Last 3 Encounters:  05/02/21 197 lb (89.4 kg)  02/15/21 196 lb (88.9 kg)  04/06/20 200 lb (90.7 kg)   Gen: wd, wn, nad otherwise not examined      Assessment:     Encounter Diagnoses  Name Primary?  Marland Kitchen  Diarrhea of presumed infectious origin Yes  . Fatigue, unspecified type   . Nausea        Plan:     We discussed her symptoms and concerns.  Discussed limitations of virtual consult.  Symptoms suggest viral gastroenteritis causing her diarrhea and nausea and some vomiting.  We discussed other differential though.  We discussed red flag symptoms that would prompt more urgent evaluation.  If she continues to feel weak or if the diarrhea does not start to let up in the next 1 to 2 days then she was advised to come in person for vital signs, and labs below including stool studies  For now push hydration throughout the day as we discussed including water, soup broth,  Gatorade, Pedialyte, ginger ale and so forth.  We discussed brat diet, nothing spicy or greasy or citrus.  We discussed using Zofran as needed for nausea and vomiting.  She can continue Tylenol for body aches or chills  We discussed typically symptoms should start to resolve at this point and diarrhea get less and overall start to feel better.  If not much improved within the next 48 hours , then get reevaluated or come in as we discussed  [name] was seen today for diarrhea.  Diagnoses and all orders for this visit:  Diarrhea of presumed infectious origin -     CBC with Differential/Platelet; Future -     Basic metabolic panel; Future -     GI Profile, Stool, PCR; Future -     Hepatic function panel; Future  Fatigue, unspecified type -     CBC with Differential/Platelet; Future -     Basic metabolic panel; Future -     GI Profile, Stool, PCR; Future -     Hepatic function panel; Future  Nausea -     CBC with Differential/Platelet; Future -     Basic metabolic panel; Future -     GI Profile, Stool, PCR; Future -     Hepatic function panel; Future  Other orders -     ondansetron (ZOFRAN ODT) 4 MG disintegrating tablet; Take 1 tablet (4 mg total) by mouth every 8 (eight) hours as needed for nausea or vomiting.  f/u prn

## 2021-06-26 ENCOUNTER — Ambulatory Visit: Payer: Self-pay

## 2021-06-26 ENCOUNTER — Other Ambulatory Visit (HOSPITAL_BASED_OUTPATIENT_CLINIC_OR_DEPARTMENT_OTHER): Payer: Self-pay

## 2021-07-06 LAB — HM MAMMOGRAPHY

## 2021-07-25 ENCOUNTER — Encounter: Payer: Self-pay | Admitting: Internal Medicine

## 2021-09-17 ENCOUNTER — Other Ambulatory Visit: Payer: Self-pay

## 2021-09-17 ENCOUNTER — Ambulatory Visit
Admission: RE | Admit: 2021-09-17 | Discharge: 2021-09-17 | Disposition: A | Discharge status: Home / Self Care | Payer: BC Managed Care – PPO | Source: Ambulatory Visit | Attending: Medical | Admitting: Medical

## 2021-09-17 DIAGNOSIS — E2839 Other primary ovarian failure: Secondary | ICD-10-CM

## 2021-09-17 DIAGNOSIS — Z0000 Encounter for general adult medical examination without abnormal findings: Secondary | ICD-10-CM

## 2021-09-17 DIAGNOSIS — Z780 Asymptomatic menopausal state: Secondary | ICD-10-CM

## 2021-09-24 ENCOUNTER — Other Ambulatory Visit: Payer: Self-pay

## 2021-09-24 ENCOUNTER — Ambulatory Visit (INDEPENDENT_AMBULATORY_CARE_PROVIDER_SITE_OTHER): Payer: BC Managed Care – PPO

## 2021-09-24 DIAGNOSIS — Z23 Encounter for immunization: Secondary | ICD-10-CM

## 2021-10-19 ENCOUNTER — Telehealth: Payer: Self-pay | Admitting: Medical

## 2021-12-12 ENCOUNTER — Telehealth (INDEPENDENT_AMBULATORY_CARE_PROVIDER_SITE_OTHER): Payer: BC Managed Care – PPO | Admitting: Medical

## 2021-12-12 ENCOUNTER — Encounter: Payer: Self-pay | Admitting: Medical

## 2021-12-12 ENCOUNTER — Other Ambulatory Visit: Payer: Self-pay

## 2021-12-12 ENCOUNTER — Ambulatory Visit
Admission: RE | Admit: 2021-12-12 | Discharge: 2021-12-12 | Disposition: A | Discharge status: Home / Self Care | Payer: BC Managed Care – PPO | Source: Ambulatory Visit | Attending: Medical | Admitting: Medical

## 2021-12-12 VITALS — Wt 197.0 lb

## 2021-12-12 DIAGNOSIS — R062 Wheezing: Secondary | ICD-10-CM | POA: Diagnosis not present

## 2021-12-12 DIAGNOSIS — J4521 Mild intermittent asthma with (acute) exacerbation: Secondary | ICD-10-CM

## 2021-12-12 DIAGNOSIS — R052 Subacute cough: Secondary | ICD-10-CM | POA: Diagnosis not present

## 2021-12-12 MED ORDER — ALBUTEROL SULFATE HFA 108 (90 BASE) MCG/ACT IN AERS: 2.0000 | INHALATION_SPRAY | Freq: Four times a day (QID) | RESPIRATORY_TRACT | 1 refills | Status: DC | PRN

## 2021-12-12 MED ORDER — HYDROCOD POLST-CPM POLST ER 10-8 MG/5ML PO SUER: 5.0000 mL | Freq: Two times a day (BID) | ORAL | 0 refills | Status: DC

## 2021-12-12 NOTE — Progress Notes (Signed)
Subjective:     Patient ID: [name], female   DOB: 30-Mar-1961, 61 y.o.   MRN: [id number]  This visit type was conducted due to national recommendations for restrictions regarding the COVID-19 Pandemic (e.g. social distancing) in an effort to limit this patient's exposure and mitigate transmission in our community.  Due to their co-morbid illnesses, this patient is at least at moderate risk for complications without adequate follow up.  This format is felt to be most appropriate for this patient at this time.    Documentation for virtual audio and video telecommunications through [place] encounter:  The patient was located at home. The provider was located in the office. The patient did consent to this visit and is aware of possible charges through their insurance for this visit.  The other persons participating in this telemedicine service were none. Time spent on call was 20 minutes and in review of previous records 20 minutes total.  This virtual service is not related to other E/M service within previous 7 days.   HPI Chief Complaint  Patient presents with   Cough    For 2 weeks - productive, chest congestion, no sinus pressure. No fever/Sore throat   Virtual consult for cough.  She notes 2+ weeks of coughing fits, cough, wheezing some.  No fever no body aches or chills.  She does not feel congested.  No sinus pressure no ear pain.  No sore throat.  No nausea vomiting or diarrhea.  Will have cough fits at times.  Over-the-counter remedies are not helping.  She does have a history of reactive airway disease.  No recent exposures.  No wet thick mucus.  No sick contacts.  Does not currently have an inhaler.  No other aggravating or relieving factors. No other complaint.   Past Medical History:  Diagnosis Date   Allergy    Anemia    History of uterine fibroid    Immune to hepatitis B 03/20/2001   Immune to mumps 01/17/2006   Immune to rubella 12/10/1983   Obesity     Reactive airway disease    Wears dentures    partial   Wears glasses    Current Outpatient Medications on File Prior to Visit  Medication Sig Dispense Refill   Multiple Vitamins-Minerals (MULTIVITAMIN WITH MINERALS) tablet Take 1 tablet by mouth daily.     No current facility-administered medications on file prior to visit.    Review of Systems As in subjective    Objective:   Physical Exam Due to coronavirus pandemic stay at home measures, patient visit was virtual and they were not examined in person.   Wt 197 lb (89.4 kg)    BMI 33.81 kg/m   General: Well-developed well-nourished no acute distress Several cough episodes during interview No obvious wheezing or labored breathing    Assessment:     Encounter Diagnoses  Name Primary?   Wheezing Yes   Subacute cough    Mild intermittent reactive airway disease with acute exacerbation         Plan:     Symptoms suggest more of a reactive airway issue at the moment.  Advise medication as below.  Caution with sedation on the cough syrup.  Use albuterol 2-3 times daily.  She has used this before.  Avoid triggers.  Go for chest x-ray.  If no improvement at all within the next 48 hours then call back.  Consider steroid if no improvement.  Consider antibiotic given the timeframe of symptoms.  Discussed limitations of virtual consult.  Continue to rest and hydrate well  Montrice was seen today for cough.  Diagnoses and all orders for this visit:  Wheezing -     DG Chest 2 View; Future  Subacute cough -     DG Chest 2 View; Future  Mild intermittent reactive airway disease with acute exacerbation -     DG Chest 2 View; Future  Other orders -     albuterol (VENTOLIN HFA) 108 (90 Base) MCG/ACT inhaler; Inhale 2 puffs into the lungs every 6 (six) hours as needed for wheezing or shortness of breath. -     chlorpheniramine-HYDROcodone (TUSSIONEX PENNKINETIC ER) 10-8 MG/5ML SUER; Take 5 mLs by mouth 2 (two) times  daily.  Follow-up pending chest x-ray

## 2022-05-04 ENCOUNTER — Encounter (HOSPITAL_COMMUNITY): Payer: Self-pay

## 2022-05-04 ENCOUNTER — Ambulatory Visit (HOSPITAL_COMMUNITY)
Admission: EM | Admit: 2022-05-04 | Discharge: 2022-05-04 | Disposition: A | Discharge status: Home / Self Care | Payer: BC Managed Care – PPO | Attending: Internal Medicine | Admitting: Internal Medicine

## 2022-05-04 DIAGNOSIS — W19XXXA Unspecified fall, initial encounter: Secondary | ICD-10-CM | POA: Diagnosis not present

## 2022-05-04 DIAGNOSIS — S00511A Abrasion of lip, initial encounter: Secondary | ICD-10-CM

## 2022-05-04 DIAGNOSIS — T148XXA Other injury of unspecified body region, initial encounter: Secondary | ICD-10-CM

## 2022-05-04 MED ORDER — ACETAMINOPHEN 325 MG PO TABS
ORAL_TABLET | ORAL | Status: AC
  Filled 2022-05-04: qty 3

## 2022-05-04 MED ORDER — ACETAMINOPHEN 325 MG PO TABS
975.0000 mg | ORAL_TABLET | Freq: Once | ORAL | Status: AC
  Administered 2022-05-04: 975 mg via ORAL

## 2022-05-04 MED ORDER — ACETAMINOPHEN 500 MG PO TABS: 1000.0000 mg | ORAL_TABLET | Freq: Four times a day (QID) | ORAL | 0 refills | Status: DC | PRN

## 2022-05-04 NOTE — ED Provider Notes (Signed)
[facility]    CSN: [id number] Arrival date & time: 05/04/22  1003      History   Chief Complaint Chief Complaint  Patient presents with   Fall   Lip Laceration    HPI [name] is a 61 y.o. female.   Patient presents urgent care for evaluation after she fell while attempting to walk into a grocery store this morning.  She was walking up a ramp on concrete when she tripped and fell onto her face hitting her upper lip and nose.  She was able to get up from the fall without assistance and walked to a [employer] nearby to get some ice to place on her lip which helped to reduce the swelling.  There is an abrasion to her upper lip and her upper lip appears very swollen at this time.  She denies dizziness, blurry vision, and decreased visual acuity.  She does report a mild headache at this time that is generalized.  She has not taken any medication for pain or swelling.  Denies history of head trauma in the past and neurological problems. Denies pain to other joints in her body from fall and she does not fall frequently. She is not on blood thinners and did not lose consciousness. She is able to walk with a steady gait without difficulty.  No numbness or tingling to bilateral upper or lower extremities at this time.  No other identifiable aggravating or relieving factors at this time.    Past Medical History:  Diagnosis Date   Allergy    Anemia    History of uterine fibroid    Immune to hepatitis B 03/20/2001   Immune to mumps 01/17/2006   Immune to rubella 12/10/1983   Obesity    Reactive airway disease    Wears dentures    partial   Wears glasses     Patient Active Problem List   Diagnosis Date Noted   Diarrhea of presumed infectious origin 05/02/2021   Fatigue 05/02/2021   Nausea 05/02/2021   Encounter for health maintenance examination in adult 02/15/2021   Screening for thyroid disorder 02/15/2021   Vitamin deficiency 02/15/2021   Screening for lipid  disorders 02/15/2021   Post-menopausal 02/15/2021   Estrogen deficiency 02/15/2021   Dizziness 03/28/2020   Loose stools 03/28/2020   Left arm pain 02/15/2020   Chronic left shoulder pain 02/15/2020   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 Virgel Bouquet   HYSTEROSCOPY D AND C     MYOMECTOMY  11/1998   ROBOT ASSISTED MYOMECTOMY  04/08/2012   Procedure: ROBOTIC ASSISTED MYOMECTOMY;  Surgeon: Alwyn Pea, MD;  Location: [facility];  Service: Gynecology;  Laterality: N/A;   TONSILECTOMY, ADENOIDECTOMY, BILATERAL MYRINGOTOMY AND TUBES     TUBAL LIGATION      OB History     Gravida  3   Para  3   Term  3   Preterm      AB      Living  3      SAB      IAB      Ectopic  Multiple      Live Births               Home Medications    Prior to Admission medications   Medication Sig Start Date End Date Taking? Authorizing Provider  acetaminophen (TYLENOL) 500 MG tablet Take 2 tablets (1,000 mg total) by mouth every 6 (six) hours as needed. 05/04/22  Yes Talbot Grumbling, FNP  albuterol (VENTOLIN HFA) 108 (90 Base) MCG/ACT inhaler Inhale 2 puffs into the lungs every 6 (six) hours as needed for wheezing or shortness of breath. 12/12/21   Tysinger, Camelia Eng, PA-C  chlorpheniramine-HYDROcodone (TUSSIONEX PENNKINETIC ER) 10-8 MG/5ML SUER Take 5 mLs by mouth 2 (two) times daily. 12/12/21   Tysinger, Camelia Eng, PA-C  Multiple Vitamins-Minerals (MULTIVITAMIN WITH MINERALS) tablet Take 1 tablet by mouth daily.    [provider]    Family History Family History  Problem Relation Age of Onset   Diabetes Mother     Hyperlipidemia Mother    Eclampsia Mother    Seizures Mother        died of seizure   Asthma Sister    Hypertension Sister    Diabetes Sister    Cancer Maternal Grandmother    Hypertension Maternal Grandmother    Hypertension Father    Gout Father    Hypertension Maternal Grandfather    Hypertension Paternal Grandmother    Heart disease Neg Hx     Social History Social History   Tobacco Use   Smoking status: Former    Packs/day: 0.25    Years: 15.00    Pack years: 3.75    Types: Cigarettes    Quit date: 12/10/1983    Years since quitting: 38.4   Smokeless tobacco: Never  Vaping Use   Vaping Use: Never used  Substance Use Topics   Alcohol use: Yes    Alcohol/week: 1.0 standard drink    Types: 1 Glasses of wine per week    Comment: occasionally   Drug use: No     Allergies   Aspirin and Shellfish allergy   Review of Systems Review of Systems Per HPI  Physical Exam Triage Vital Signs ED Triage Vitals  Enc Vitals Group     BP 05/04/22 1028 (!) 144/77     Pulse Rate 05/04/22 1028 61     Resp 05/04/22 1028 17     Temp 05/04/22 1028 98 F (36.7 C)     Temp Source 05/04/22 1028 Oral     SpO2 05/04/22 1028 99 %     Weight --      Height --      Head Circumference --      Peak Flow --      Pain Score 05/04/22 1032 4     Pain Loc --      Pain Edu? --      Excl. in [place]? --    No data found.  Updated Vital Signs BP (!) 144/77 (BP Location: Left Arm)   Pulse 61   Temp 98 F (36.7 C) (Oral)   Resp 17   SpO2 99%   Visual Acuity Right Eye Distance:   Left Eye Distance:   Bilateral Distance:    Right Eye Near:   Left Eye Near:    Bilateral Near:     Physical Exam Vitals and nursing note reviewed.  Constitutional:      General: She is not in acute distress.    Appearance: Normal appearance. She is well-developed.  She is not ill-appearing or diaphoretic.  HENT:     Head: Normocephalic and atraumatic.     Right Ear: Tympanic membrane, ear canal and  external ear normal. There is no impacted cerumen.     Left Ear: Tympanic membrane, ear canal and external ear normal. There is no impacted cerumen.     Nose: Nose normal. No rhinorrhea.     Mouth/Throat:     Lips: Pink.     Mouth: Mucous membranes are moist.     Tongue: Tongue does not deviate from midline.     Pharynx: Uvula midline. No pharyngeal swelling, oropharyngeal exudate or posterior oropharyngeal erythema.     Tonsils: No tonsillar exudate or tonsillar abscesses.     Comments: See image in chart below physical exam for further detail.  Abrasion to upper lip slightly left of midline.  Moderate lip swelling visualized.  Airway is intact and patient is able to control her secretions.  She is not in any respiratory distress at this time.  Teeth are intact and are not loose.  Oral mucosa appears normal and is moist with no bleeding or drainage. Eyes:     Extraocular Movements: Extraocular movements intact.     Conjunctiva/sclera: Conjunctivae normal.  Cardiovascular:     Rate and Rhythm: Normal rate and regular rhythm.     Heart sounds: Normal heart sounds. No murmur heard.   No friction rub. No gallop.  Pulmonary:     Effort: Pulmonary effort is normal. No respiratory distress.     Breath sounds: Normal breath sounds. No wheezing, rhonchi or rales.  Chest:     Chest wall: No tenderness.  Abdominal:     General: Bowel sounds are normal.     Palpations: Abdomen is soft.     Tenderness: There is no abdominal tenderness.  Musculoskeletal:        General: No swelling.     Cervical back: Normal, normal range of motion and neck supple.     Thoracic back: Normal.     Lumbar back: Normal.  Lymphadenopathy:     Cervical: No cervical adenopathy.  Skin:    General: Skin is warm and dry.     Capillary Refill: Capillary refill takes less than 2 seconds.     Findings: No rash.  Neurological:     General: No focal deficit present.     Mental Status: She is alert and oriented to  person, place, and time. Mental status is at baseline.     Cranial Nerves: Cranial nerves 2-12 are intact. No dysarthria or facial asymmetry.     Sensory: Sensation is intact.     Motor: Motor function is intact. No weakness, tremor or pronator drift.     Coordination: Coordination is intact. Romberg sign negative. Finger-Nose-Finger Test normal.     Gait: Gait is intact. Gait normal.     Comments: 5/5 grip strength to bilateral upper extremities.  5/5 strength against resistance with abduction and abduction of bilateral upper extremities and lower extremities.  Patient walks with a steady gait without assistance.  Speech is normal.  No facial asymmetry.  Visual tracking is normal.  Psychiatric:        Mood and Affect: Mood normal.        Behavior: Behavior normal.        Thought Content: Thought content normal.        Judgment: Judgment normal.       UC Treatments / Results  Labs (all labs ordered  are listed, but only abnormal results are displayed) Labs Reviewed - No data to display  EKG   Radiology No results found.  Procedures Procedures (including critical care time)  Medications Ordered in UC Medications  acetaminophen (TYLENOL) tablet 975 mg (975 mg Oral Given 05/04/22 1146)    Initial Impression / Assessment and Plan / UC Course  I have reviewed the triage vital signs and the nursing notes.  Pertinent labs & imaging results that were available during my care of the patient were reviewed by me and considered in my medical decision making (see chart for details).  Patient is a 61 year old female presenting to urgent care today after she fell while walking into a convenience store this morning and hit her upper lip.  She denies loss of consciousness.  Neurologic exam is stable at this time and there is no clinical indication for emergent evaluation or advanced imaging at this time.  There is a superficial abrasion to patient's upper lip and bleeding is controlled.   Suspect lip swelling will reduce with application of ice to upper lip and 15 to 20-minute intervals over the next few days.  Patient to take Tylenol extra strength every 6 hours as needed for head and upper lip pain.  She is allergic to aspirin and I further do not recommend ibuprofen use due to low risk of subdural hematoma formation related to falling and hitting her head.  Advised patient to go home and rest for the next few days.  If she develops any new or worsening neurologic symptoms, she is to report to urgent care or the ER immediately for severe symptoms for further evaluation.   Counseled patient regarding appropriate use of medications and potential side effects for all medications recommended or prescribed today. Discussed red flag signs and symptoms of worsening condition,when to call the PCP office, return to urgent care, and when to seek higher level of care. Patient verbalizes understanding and agreement with plan. All questions answered. Patient discharged in stable condition.   Final Clinical Impressions(s) / UC Diagnoses   Final diagnoses:  Fall, initial encounter  Abrasion     Discharge Instructions      You are seen urgent care today for your abrasion to your upper lip after a fall this morning.  Apply ice in 15-20 minute intervals to decrease swelling. Do not take ibuprofen due to allergy to aspirin. Take tylenol extra strength every 6 hours as needed for pain to your upper lip and your head.  Your next dose of Tylenol may be tonight at 6 PM since you were given some in the clinic today.    If you develop any neck pain, blurry vision, decreased visual acuity, significant dizziness, or worsening head pain that does not go away with Tylenol, please return to urgent care.  If your symptoms are severe, go to the emergency room immediately.  Otherwise, follow-up with your primary care provider as needed for further evaluation and for ongoing wellness visits.    ED  Prescriptions     Medication Sig Dispense Auth. Provider   acetaminophen (TYLENOL) 500 MG tablet Take 2 tablets (1,000 mg total) by mouth every 6 (six) hours as needed. 30 tablet Talbot Grumbling, FNP      PDMP not reviewed this encounter.   Talbot Grumbling, [name] 05/04/22 1206

## 2022-05-04 NOTE — ED Triage Notes (Signed)
Pt presents with small lip laceration and lip swelling after a fall outside a convenience store this morning.

## 2022-05-04 NOTE — Discharge Instructions (Addendum)
You are seen urgent care today for your abrasion to your upper lip after a fall this morning.  Apply ice in 15-20 minute intervals to decrease swelling. Do not take ibuprofen due to allergy to aspirin. Take tylenol extra strength every 6 hours as needed for pain to your upper lip and your head.  Your next dose of Tylenol may be tonight at 6 PM since you were given some in the clinic today.    If you develop any neck pain, blurry vision, decreased visual acuity, significant dizziness, or worsening head pain that does not go away with Tylenol, please return to urgent care.  If your symptoms are severe, go to the emergency room immediately.  Otherwise, follow-up with your primary care provider as needed for further evaluation and for ongoing wellness visits.

## 2022-08-04 LAB — HM PAP SMEAR

## 2022-08-14 ENCOUNTER — Encounter: Payer: Self-pay | Admitting: Internal Medicine

## 2022-09-17 ENCOUNTER — Encounter: Payer: Self-pay | Admitting: Internal Medicine

## 2022-10-03 ENCOUNTER — Encounter: Payer: BC Managed Care – PPO | Admitting: Medical

## 2022-10-17 ENCOUNTER — Encounter: Payer: BC Managed Care – PPO | Admitting: Medical

## 2022-10-30 ENCOUNTER — Encounter: Payer: BC Managed Care – PPO | Admitting: Medical

## 2023-01-21 ENCOUNTER — Ambulatory Visit: Payer: BC Managed Care – PPO | Admitting: Medical

## 2023-01-21 VITALS — BP 110/68 | HR 78 | Ht 64.5 in | Wt 198.0 lb

## 2023-01-21 DIAGNOSIS — J4541 Moderate persistent asthma with (acute) exacerbation: Secondary | ICD-10-CM | POA: Diagnosis not present

## 2023-01-21 DIAGNOSIS — E2839 Other primary ovarian failure: Secondary | ICD-10-CM

## 2023-01-21 DIAGNOSIS — J301 Allergic rhinitis due to pollen: Secondary | ICD-10-CM | POA: Diagnosis not present

## 2023-01-21 DIAGNOSIS — Z131 Encounter for screening for diabetes mellitus: Secondary | ICD-10-CM | POA: Insufficient documentation

## 2023-01-21 DIAGNOSIS — Z7185 Encounter for immunization safety counseling: Secondary | ICD-10-CM | POA: Diagnosis not present

## 2023-01-21 DIAGNOSIS — R194 Change in bowel habit: Secondary | ICD-10-CM | POA: Insufficient documentation

## 2023-01-21 DIAGNOSIS — Z780 Asymptomatic menopausal state: Secondary | ICD-10-CM

## 2023-01-21 DIAGNOSIS — Z136 Encounter for screening for cardiovascular disorders: Secondary | ICD-10-CM | POA: Insufficient documentation

## 2023-01-21 DIAGNOSIS — Z13220 Encounter for screening for lipoid disorders: Secondary | ICD-10-CM

## 2023-01-21 DIAGNOSIS — Z0000 Encounter for general adult medical examination without abnormal findings: Secondary | ICD-10-CM

## 2023-01-21 NOTE — Progress Notes (Unsigned)
Subjective:   HPI  [name] is a 62 y.o. female who presents for Chief Complaint  Patient presents with   fasting cpe    Fasting cpe, gaining weight and not sure why. Bowels are changing.     Patient Care Team: Samarth Ogle, Camelia Eng, PA-C as PCP - General (Family Medicine) Crawford Givens, MD as Consulting Physician (Obstetrics and Gynecology)  Medical care team includes: Dr. Fayne Mediate, gynecology Dr. Archie Balboa, dentist Dr. Delman Cheadle, ophthalmology Dr. Carol Ada, GI Dr. Maureen Ralphs, [facility] Dr. Joslyn Hy, oral and maxillofacial surgery Dr. Myna Hidalgo, Prosthodontics Dr. Mingo Amber, plastic surgery consult Gloris [place], [place], dermatology Allyson Sabal   Concerns: Needs help losing weight.  She is still doing weight watchers which she is continue to do for a while.  She is exercising with walking.  She is having trouble losing weight though.  Will to consider weight loss medication.  Reviewed their medical, surgical, family, social, medication, and allergy history and updated chart as appropriate.  Past Medical History:  Diagnosis Date   Allergy    Anemia    History of uterine fibroid    Immune to hepatitis B 03/20/2001   Immune to mumps 01/17/2006   Immune to rubella 12/10/1983   Obesity    Reactive airway disease    Wears dentures    partial   Wears glasses     Family History  Problem Relation Age of Onset   Diabetes Mother    Hyperlipidemia Mother    Eclampsia Mother    Seizures Mother        died of seizure   Asthma Sister    Hypertension Sister    Diabetes Sister    Cancer Maternal Grandmother    Hypertension Maternal Grandmother    Hypertension Father    Gout Father    Hypertension Maternal Grandfather    Hypertension Paternal Grandmother    Heart disease Neg Hx      Current Outpatient Medications:    acetaminophen (TYLENOL) 500 MG tablet, Take 2 tablets (1,000 mg total) by mouth every 6 (six) hours as needed. (Patient not taking:  Reported on 01/21/2023), Disp: 30 tablet, Rfl: 0   albuterol (VENTOLIN HFA) 108 (90 Base) MCG/ACT inhaler, Inhale 2 puffs into the lungs every 6 (six) hours as needed for wheezing or shortness of breath. (Patient not taking: Reported on 01/21/2023), Disp: 18 g, Rfl: 1   chlorpheniramine-HYDROcodone (TUSSIONEX PENNKINETIC ER) 10-8 MG/5ML SUER, Take 5 mLs by mouth 2 (two) times daily. (Patient not taking: Reported on 01/21/2023), Disp: 140 mL, Rfl: 0   Multiple Vitamins-Minerals (MULTIVITAMIN WITH MINERALS) tablet, Take 1 tablet by mouth daily. (Patient not taking: Reported on 01/21/2023), Disp: , Rfl:   Allergies  Allergen Reactions   Aspirin Hives    hives   Shellfish Allergy Hives      Review of Systems Constitutional: -fever, -chills, -sweats, -unexpected weight change, -decreased appetite, -fatigue Allergy: -sneezing, -itching, -congestion Dermatology: -changing moles, --rash, -lumps ENT: -runny nose, -ear pain, -sore throat, -hoarseness, -sinus pain, -teeth pain, - ringing in ears, -hearing loss, -nosebleeds Cardiology: -chest pain, -palpitations, -swelling, -difficulty breathing when lying flat, -waking up short of breath Respiratory: -cough, -shortness of breath, -difficulty breathing with exercise or exertion, -wheezing, -coughing up blood Gastroenterology: -abdominal pain, -nausea, -vomiting, -diarrhea, -constipation, -blood in stool, -changes in bowel movement, -difficulty swallowing or eating Hematology: -bleeding, -bruising  Musculoskeletal: -joint aches, -muscle aches, -joint swelling, -back pain, -neck pain, -cramping, -changes in gait Ophthalmology: denies vision changes, eye  redness, itching, discharge Urology: -burning with urination, -difficulty urinating, -blood in urine, -urinary frequency, -urgency, -incontinence Neurology: -headache, -weakness, -tingling, -numbness, -memory loss, -falls, -dizziness Psychology: -depressed mood, -agitation, -sleep problems Breast/gyn:  -breast tendnerss, -discharge, -lumps, -vaginal discharge,- irregular periods, -heavy periods     Objective:  BP 110/68   Pulse 78   Ht 5' 4.5" (1.638 m)   Wt 198 lb (89.8 kg)   BMI 33.46 kg/m   General appearance: alert, no distress, WD/WN, African American female Skin: unremarkable HEENT: normocephalic, conjunctiva/corneas normal, sclerae anicteric, PERRLA, EOMi, nares patent, no discharge or erythema, pharynx normal Oral cavity: MMM, tongue normal, teeth normal Neck: supple, no lymphadenopathy, no thyromegaly, no masses, normal ROM, no bruits Chest: non tender, normal shape and expansion Heart: RRR, normal S1, S2, no murmurs Lungs: CTA bilaterally, no wheezes, rhonchi, or rales Abdomen: +bs, soft, non tender, non distended, no masses, no hepatomegaly, no splenomegaly, no bruits Back: non tender, normal ROM, no scoliosis Musculoskeletal: upper extremities non tender, no obvious deformity, normal ROM throughout, lower extremities non tender, no obvious deformity, normal ROM throughout Extremities: no edema, no cyanosis, no clubbing Pulses: 2+ symmetric, upper and lower extremities, normal cap refill Neurological: alert, oriented x 3, CN2-12 intact, strength normal upper extremities and lower extremities, sensation normal throughout, DTRs 2+ throughout, no cerebellar signs, gait normal Psychiatric: normal affect, behavior normal, pleasant  Breast/gyn/rectal - deferred to gynecology     Assessment and Plan :   Encounter Diagnoses  Name Primary?   Encounter for health maintenance examination in adult Yes   Moderate persistent extrinsic asthma with acute exacerbation    Allergic rhinitis due to pollen, unspecified seasonality    Vaccine counseling    Screening for lipid disorders    Post-menopausal    Estrogen deficiency    Screening for diabetes mellitus    Bowel habit changes    Screening for heart disease      Today you had a preventative care visit or wellness visit.     Topics today may have included healthy lifestyle, diet, exercise, preventative care, vaccinations, sick and well care, proper use of emergency dept and after hours care, as well as other concerns.     Recommendations: Continue to return yearly for your annual wellness and preventative care visits.  This gives [place] a chance to discuss healthy lifestyle, exercise, vaccinations, review your chart record, and perform screenings where appropriate.  I recommend you see your eye doctor yearly for routine vision care.  I recommend you see your dentist yearly for routine dental care including hygiene visits twice yearly.  See your gynecologist yearly for routine gynecological care.    Vaccination recommendations were reviewed Immunization History  Administered Date(s) Administered   Hepatitis B 05/05/2000, 06/02/2000, 12/15/2000   Influenza,inj,Quad PF,6+ Mos 10/13/2018, 09/28/2019   Influenza-Unspecified 10/26/2020, 09/30/2022   MMR 02/13/1999   Measles 02/22/1971   PFIZER(Purple Top)SARS-COV-2 Vaccination 02/17/2020, 03/13/2020, 11/06/2020   PPD Test 06/13/1999, 05/02/2000, 12/15/2000, 12/17/2001, 01/01/2003, 01/23/2004, 01/30/2005, 01/17/2006, 01/06/2007, 01/17/2015   Pfizer Covid-19 Vaccine Bivalent Booster 36yr & up 09/24/2021   Pneumococcal Polysaccharide-23 05/02/2021   Td 09/24/1996   Tdap 01/17/2006, 08/01/2009, 10/16/2020   Varicella 05/02/2000   Zoster Recombinat (Shingrix) 06/13/2020, 08/21/2020     Screening for cancer: Breast cancer screening: You should perform a self breast exam monthly.   We reviewed recommendations for regular mammograms and breast cancer screening.  Colon cancer screening:  I reviewed your colonoscopy on file that is up to date from 2020  Cervical cancer screening: We reviewed recommendations for pap smear screening.  We will request recent pap smear from gynecology  Skin cancer screening: Check your skin regularly for new changes, growing  lesions, or other lesions of concern Come in for evaluation if you have skin lesions of concern.  Lung cancer screening: If you have a greater than 30 pack year history of tobacco use, then you qualify for lung cancer screening with a chest CT scan  We currently don't have screenings for other cancers besides breast, cervical, colon, and lung cancers.  If you have a strong family history of cancer or have other cancer screening concerns, please let me know.    Bone health: Get at least 150 minutes of aerobic exercise weekly Get weight bearing exercise at least once weekly You are due for bone density test.  Please call to schedule your bone density test   [facility] [address], [place], [place] [address]  Bone Density tests 09/2021 showed osteopenia or low bone mass.  Plan to repeat this 09/2023.  Please call to schedule your bone density test.   [facility]  [id number], [place], [place] [address]    Heart health: Get at least 150 minutes of aerobic exercise weekly Limit alcohol It is important to maintain a healthy blood pressure and healthy cholesterol numbers    Separate significant issues discussed: Asthma-mild intermittent, no recent problems or symptoms.  BMI >30 - work on efforts to lose weight.   Counseled on exercise, diet, working with a trainer at the [facility] to get started on some weightbearing exercise.  Pending labs we will likely use a trial of Wegovy.  We discussed Wegovy medication , risk,  benefits and proper use.   [place] was seen today for fasting cpe.  Diagnoses and all orders for this visit:  Encounter for health maintenance examination in adult -     Comprehensive metabolic panel -     CBC with Differential/Platelet -     Lipid panel -     Hemoglobin A1c -     POCT Urinalysis DIP (Proadvantage Device) -     TSH -     CT  CARDIAC SCORING (SELF PAY ONLY); Future  Moderate persistent extrinsic asthma with acute exacerbation  Allergic rhinitis due to pollen, unspecified seasonality  Vaccine counseling  Screening for lipid disorders -     Lipid panel  Post-menopausal  Estrogen deficiency  Screening for diabetes mellitus -     Hemoglobin A1c  Bowel habit changes -     TSH  Screening for heart disease -     CT CARDIAC SCORING (SELF PAY ONLY); Future      Follow-up pending labs, yearly for physical

## 2023-01-22 ENCOUNTER — Other Ambulatory Visit: Payer: Self-pay | Admitting: Medical

## 2023-01-22 ENCOUNTER — Other Ambulatory Visit: Payer: BC Managed Care – PPO

## 2023-01-22 DIAGNOSIS — Z0000 Encounter for general adult medical examination without abnormal findings: Secondary | ICD-10-CM

## 2023-01-22 LAB — POCT URINALYSIS DIP (PROADVANTAGE DEVICE)
Bilirubin, UA: NEGATIVE
Blood, UA: NEGATIVE
Glucose, UA: NEGATIVE mg/dL
Ketones, POC UA: NEGATIVE mg/dL
Leukocytes, UA: NEGATIVE
Nitrite, UA: NEGATIVE
Protein Ur, POC: NEGATIVE mg/dL
Specific Gravity, Urine: 1.01
Urobilinogen, Ur: NEGATIVE
pH, UA: 6 (ref 5.0–8.0)

## 2023-01-22 LAB — HEMOGLOBIN A1C
Est. average glucose Bld gHb Est-mCnc: 114 mg/dL
Hgb A1c MFr Bld: 5.6 % (ref 4.8–5.6)

## 2023-01-22 LAB — CBC WITH DIFFERENTIAL/PLATELET
Basophils Absolute: 0 10*3/uL (ref 0.0–0.2)
Basos: 1 %
EOS (ABSOLUTE): 0.1 10*3/uL (ref 0.0–0.4)
Eos: 1 %
Hematocrit: 42.6 % (ref 34.0–46.6)
Hemoglobin: 13.7 g/dL (ref 11.1–15.9)
Immature Grans (Abs): 0 10*3/uL (ref 0.0–0.1)
Immature Granulocytes: 0 %
Lymphocytes Absolute: 2.3 10*3/uL (ref 0.7–3.1)
Lymphs: 28 %
MCH: 27.8 pg (ref 26.6–33.0)
MCHC: 32.2 g/dL (ref 31.5–35.7)
MCV: 86 fL (ref 79–97)
Monocytes Absolute: 0.6 10*3/uL (ref 0.1–0.9)
Monocytes: 8 %
Neutrophils Absolute: 4.9 10*3/uL (ref 1.4–7.0)
Neutrophils: 62 %
Platelets: 214 10*3/uL (ref 150–450)
RBC: 4.93 x10E6/uL (ref 3.77–5.28)
RDW: 13 % (ref 11.7–15.4)
WBC: 8 10*3/uL (ref 3.4–10.8)

## 2023-01-22 LAB — COMPREHENSIVE METABOLIC PANEL
ALT: 15 IU/L (ref 0–32)
AST: 19 IU/L (ref 0–40)
Albumin/Globulin Ratio: 1.5 (ref 1.2–2.2)
Albumin: 4.3 g/dL (ref 3.9–4.9)
Alkaline Phosphatase: 70 IU/L (ref 44–121)
BUN/Creatinine Ratio: 26 (ref 12–28)
BUN: 18 mg/dL (ref 8–27)
Bilirubin Total: 0.5 mg/dL (ref 0.0–1.2)
CO2: 21 mmol/L (ref 20–29)
Calcium: 9.8 mg/dL (ref 8.7–10.3)
Chloride: 104 mmol/L (ref 96–106)
Creatinine, Ser: 0.68 mg/dL (ref 0.57–1.00)
Globulin, Total: 2.9 g/dL (ref 1.5–4.5)
Glucose: 75 mg/dL (ref 70–99)
Potassium: 4 mmol/L (ref 3.5–5.2)
Sodium: 140 mmol/L (ref 134–144)
Total Protein: 7.2 g/dL (ref 6.0–8.5)
eGFR: 97 mL/min/{1.73_m2} (ref >59)

## 2023-01-22 LAB — LIPID PANEL
Chol/HDL Ratio: 2.7 ratio (ref 0.0–4.4)
Cholesterol, Total: 179 mg/dL (ref 100–199)
HDL: 67 mg/dL (ref >39)
LDL Chol Calc (NIH): 100 mg/dL — ABNORMAL HIGH (ref 0–99)
Triglycerides: 60 mg/dL (ref 0–149)
VLDL Cholesterol Cal: 12 mg/dL (ref 5–40)

## 2023-01-22 LAB — TSH: TSH: 1.19 u[IU]/mL (ref 0.450–4.500)

## 2023-01-22 MED ORDER — WEGOVY 0.25 MG/0.5ML ~~LOC~~ SOAJ: 0.2500 mg | SUBCUTANEOUS | 0 refills | Status: DC

## 2023-01-22 NOTE — Progress Notes (Signed)
Results sent through MyChart

## 2023-02-04 ENCOUNTER — Ambulatory Visit (HOSPITAL_COMMUNITY)
Admission: RE | Admit: 2023-02-04 | Discharge: 2023-02-04 | Disposition: A | Discharge status: Home / Self Care | Payer: BC Managed Care – PPO | Source: Ambulatory Visit | Attending: Medical | Admitting: Medical

## 2023-02-04 DIAGNOSIS — Z136 Encounter for screening for cardiovascular disorders: Secondary | ICD-10-CM

## 2023-02-04 DIAGNOSIS — Z0000 Encounter for general adult medical examination without abnormal findings: Secondary | ICD-10-CM | POA: Insufficient documentation

## 2023-02-04 NOTE — Progress Notes (Signed)
Results sent through MyChart

## 2023-08-05 LAB — HM MAMMOGRAPHY

## 2023-10-19 IMAGING — DX DG CHEST 2V
2 series · 2 of 2 positions shown · non-contrast
Comparison: None.

CLINICAL DATA: Shortness of breath with cough.

EXAM:
CHEST - 2 VIEW

[dg chest 2 view (1 of 2)]
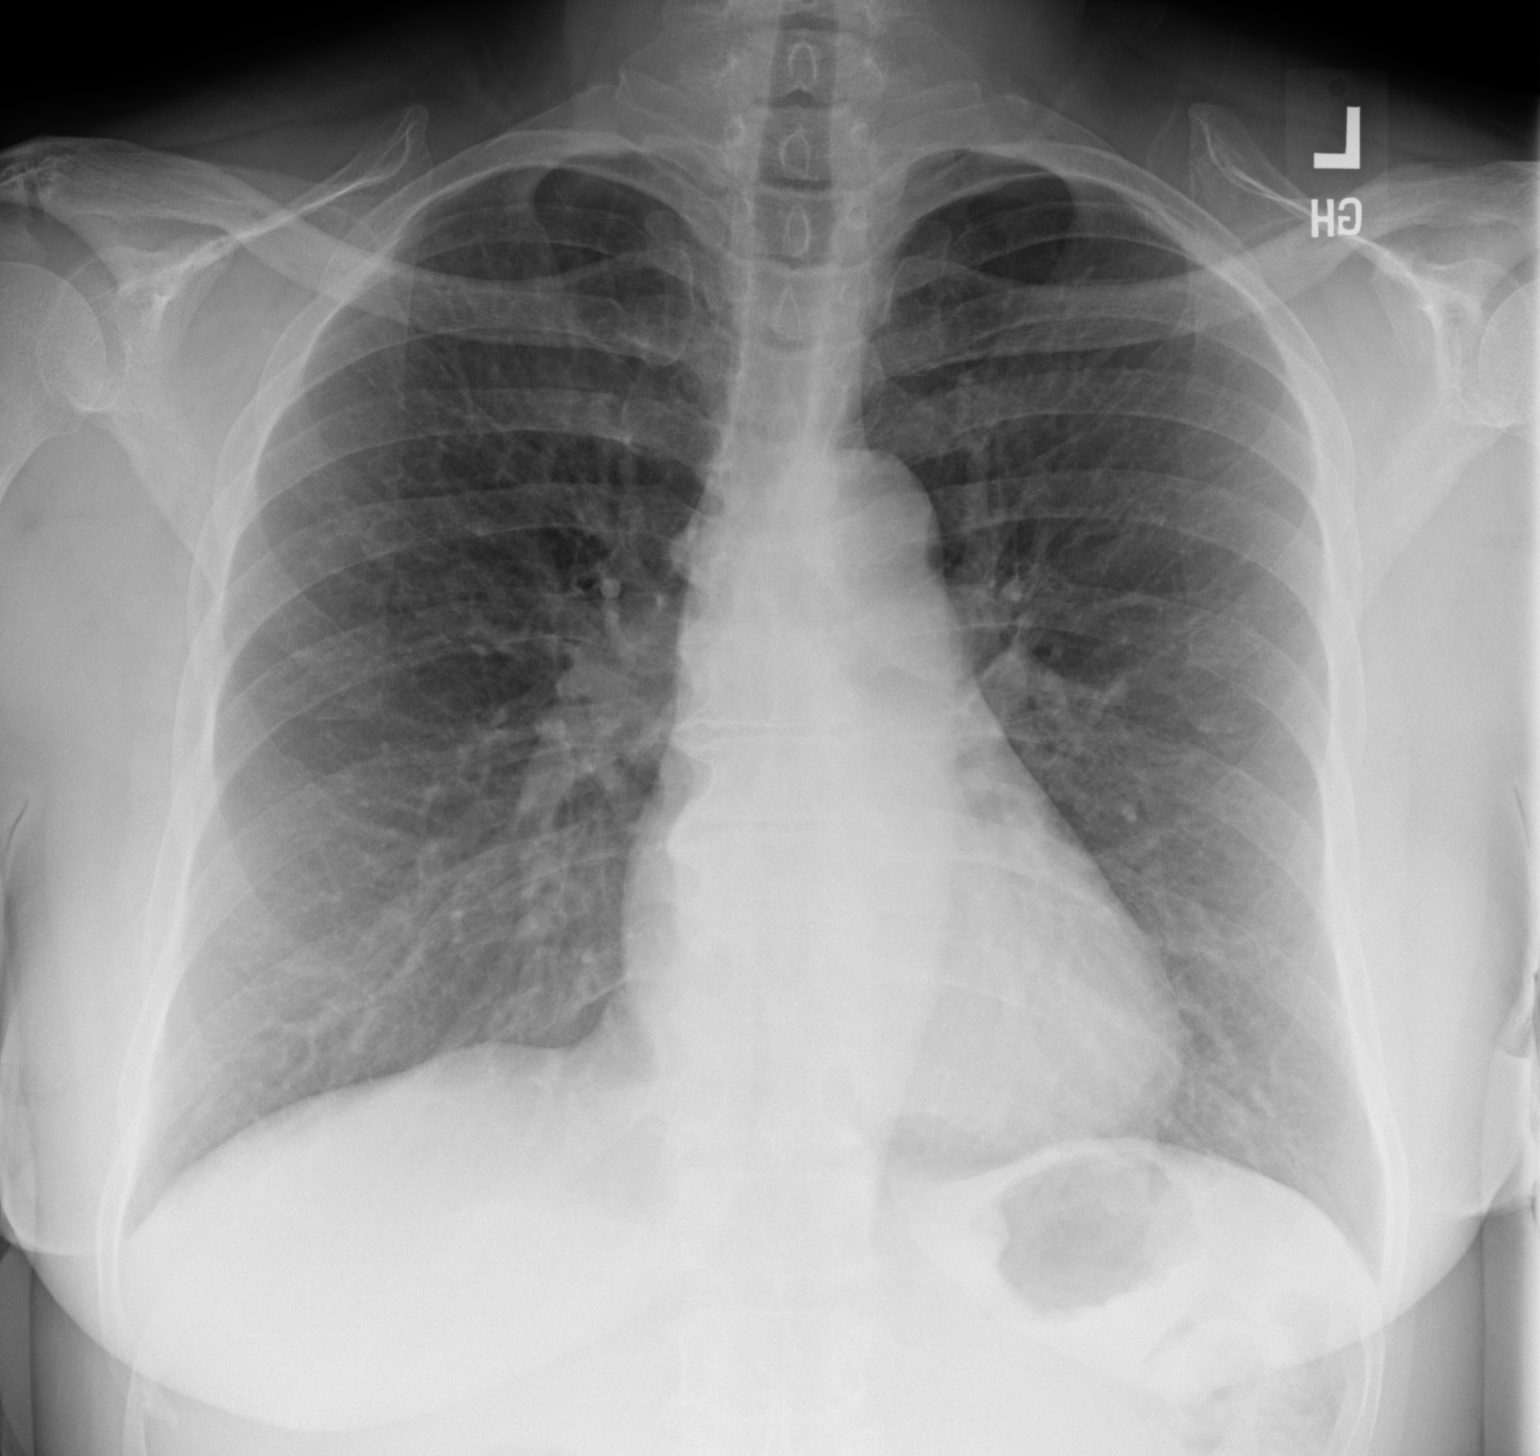

[dg chest 2 view (2 of 2)]
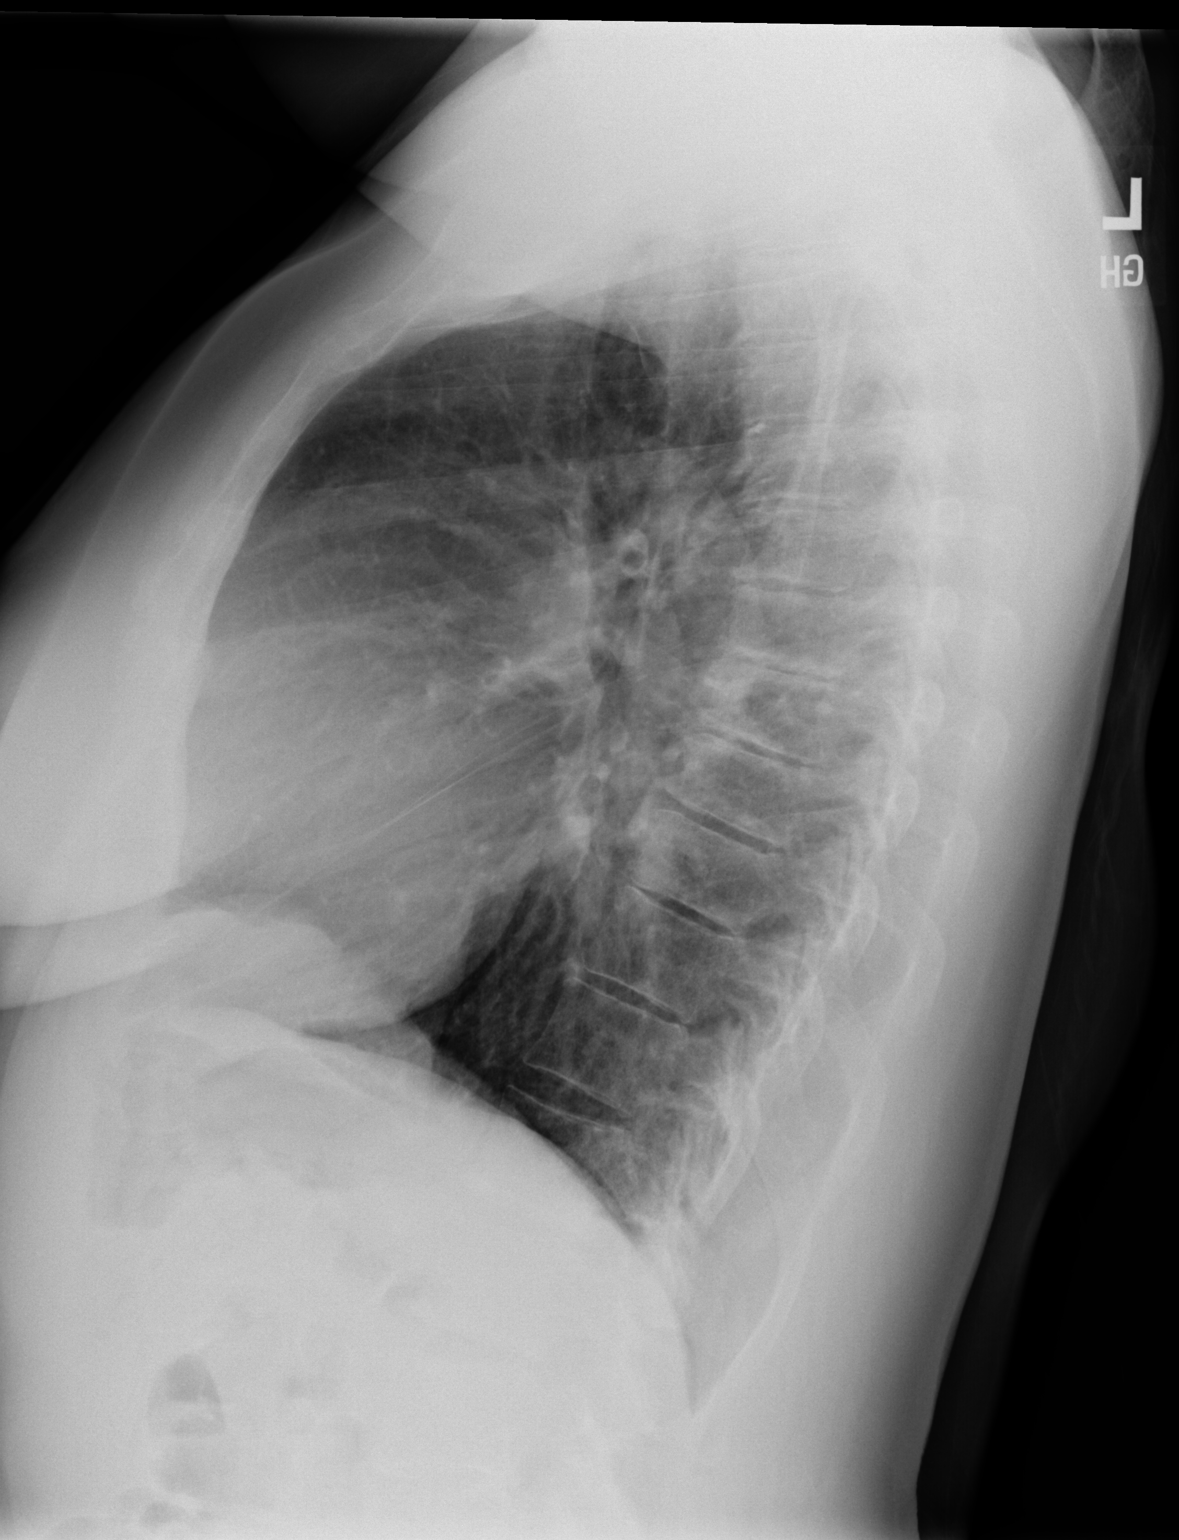

[2 of 2 positions shown; findings below may reference images not displayed]

FINDINGS: The heart size and mediastinal contours are within normal limits.
Both lungs are clear. The visualized skeletal structures are
unremarkable.
IMPRESSION: No active cardiopulmonary disease.

## 2024-01-27 ENCOUNTER — Encounter: Payer: BC Managed Care – PPO | Admitting: Medical

## 2024-02-17 ENCOUNTER — Encounter: Payer: Self-pay | Admitting: Medical

## 2024-02-17 ENCOUNTER — Ambulatory Visit: Payer: BC Managed Care – PPO | Admitting: Medical

## 2024-02-17 VITALS — BP 110/64 | HR 86 | Ht 63.5 in | Wt 184.0 lb

## 2024-02-17 DIAGNOSIS — Z0000 Encounter for general adult medical examination without abnormal findings: Secondary | ICD-10-CM | POA: Diagnosis not present

## 2024-02-17 DIAGNOSIS — M8580 Other specified disorders of bone density and structure, unspecified site: Secondary | ICD-10-CM | POA: Insufficient documentation

## 2024-02-17 DIAGNOSIS — Z974 Presence of external hearing-aid: Secondary | ICD-10-CM

## 2024-02-17 DIAGNOSIS — Z780 Asymptomatic menopausal state: Secondary | ICD-10-CM

## 2024-02-17 DIAGNOSIS — Z136 Encounter for screening for cardiovascular disorders: Secondary | ICD-10-CM

## 2024-02-17 DIAGNOSIS — Z7185 Encounter for immunization safety counseling: Secondary | ICD-10-CM

## 2024-02-17 DIAGNOSIS — Z23 Encounter for immunization: Secondary | ICD-10-CM | POA: Diagnosis not present

## 2024-02-17 DIAGNOSIS — Z13220 Encounter for screening for lipoid disorders: Secondary | ICD-10-CM

## 2024-02-17 LAB — LIPID PANEL

## 2024-02-17 NOTE — Addendum Note (Signed)
 Addended by: Herminio Commons A on: 02/17/2024 02:43 PM   Modules accepted: Orders

## 2024-02-17 NOTE — Progress Notes (Signed)
 Subjective:    [name] is a 63 y.o. female who presents for Preventative Services visit and chronic medical problems/med check visit.    Primary Care Provider Staisha Winiarski, Kermit Balo, PA-C here for primary care  Current Health Care Team: Dentist Eye doctor, [facility] Ophthalmology Dr. Madilyn Hook, [facility] OB/Gyn Dr. Jeani Hawking, GI Dr. Trudee Grip, orthopedics  Medical Services you may have received from other than [facility] providers in the past year (date may be approximate) Gyn, eye doctor, dentist  Exercise Current exercise habits:  walking 5 days per week, no weight training of recent  Nutrition/Diet Current diet: overall healthy, doing Weight watchers, some intermittent fasting  Depression Screen    02/17/2024    1:55 PM  Depression screen PHQ 2/9  Decreased Interest 0  Down, Depressed, Hopeless 0  PHQ - 2 Score 0    Activities of Daily Living Screen/Functional Status Survey Is the patient deaf or have difficulty hearing?: Yes (has hearing aids) Does the patient have difficulty seeing, even when wearing glasses/contacts?: No Does the patient have difficulty concentrating, remembering, or making decisions?: No Does the patient have difficulty walking or climbing stairs?: No Does the patient have difficulty dressing or bathing?: No Does the patient have difficulty doing errands alone such as visiting a doctor's office or shopping?: No  Can patient draw a clock face showing 3:15 oclock, yes  Fall Risk Screen    02/17/2024    1:55 PM 01/21/2023   11:55 AM 02/15/2020   10:21 AM 07/06/2019   11:33 AM 08/25/2018    8:21 AM  Fall Risk   Falls in the past year? 0 1 0 0 Yes  Number falls in past yr: 0 0   1  Injury with Fall? 0 1   Yes  Risk for fall due to : No Fall Risks Impaired balance/gait   Other (Comment)  Follow up Falls evaluation completed Falls evaluation completed  Falls evaluation completed     Gait Assessment: Normal gait observed -  yes  Advanced directives Does patient have a Health Care Power of Attorney? No Does patient have a Living Will? No  Past Medical History:  Diagnosis Date   Allergy    Anemia    History of uterine fibroid    Immune to hepatitis B 03/20/2001   Immune to mumps 01/17/2006   Immune to rubella 12/10/1983   Obesity    Reactive airway disease    Wears dentures    partial   Wears glasses     Past Surgical History:  Procedure Laterality Date   COLONOSCOPY  03/2010   hyperplastic polyp, repeat 2021; Dr. Nance Pew   HYSTEROSCOPY D AND C     MYOMECTOMY  11/1998   ROBOT ASSISTED MYOMECTOMY  04/08/2012   Procedure: ROBOTIC ASSISTED MYOMECTOMY;  Surgeon: Esmeralda Arthur, MD;  Location: [facility];  Service: Gynecology;  Laterality: N/A;   TONSILECTOMY, ADENOIDECTOMY, BILATERAL MYRINGOTOMY AND TUBES     TUBAL LIGATION      Social History   Socioeconomic History   Marital status: Single    Spouse name: Not on file   Number of children: Not on file   Years of education: Not on file   Highest education level: Not on file  Occupational History    Industry: Health Care  Tobacco Use   Smoking status: Former    Current packs/day: 0.00    Average packs/day: 0.3 packs/day for 15.0 years (3.8 ttl pk-yrs)    Types: Cigarettes  Start date: 12/09/1968    Quit date: 12/10/1983    Years since quitting: 40.2   Smokeless tobacco: Never  Vaping Use   Vaping status: Never Used  Substance and Sexual Activity   Alcohol use: Yes    Alcohol/week: 1.0 standard drink of alcohol    Types: 1 Glasses of wine per week    Comment: occasionally   Drug use: No   Sexual activity: Not Currently    Partners: Male    Birth control/protection: Other-see comments    Comment: BTL  Other Topics Concern   Not on file  Social History Narrative   Has 3 children, all adults.   1 child in [place], 2 in [place]. Exercise - treadmill, elliptical.  [employer].  Divorced, no significant other, lives with grand-daughter.   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  02/2024   Social Drivers of Health   Financial Resource Strain: Low Risk  (02/17/2024)   Overall Financial Resource Strain (CARDIA)    Difficulty of Paying Living Expenses: Not very hard  Food Insecurity: No Food Insecurity (02/17/2024)   Hunger Vital Sign    Worried About Running Out of Food in the Last Year: Never true    Ran Out of Food in the Last Year: Never true  Transportation Needs: No Transportation Needs (02/17/2024)   PRAPARE - Administrator, Civil Service (Medical): No    Lack of Transportation (Non-Medical): No  Physical Activity: Sufficiently Active (02/17/2024)   Exercise Vital Sign    Days of Exercise per Week: 5 days    Minutes of Exercise per Session: 80 min  Stress: Stress Concern Present (02/17/2024)   [employer] of Occupational Health - Occupational Stress Questionnaire    Feeling of Stress : To some extent  Social Connections: Moderately Integrated (02/17/2024)   Social Connection and Isolation Panel [NHANES]    Frequency of Communication with Friends and Family: More than three times a week    Frequency of Social Gatherings with Friends and Family: Once a week    Attends Religious Services: 1 to 4 times per year    Active Member of [employer] or Organizations: Yes    Attends Banker Meetings: 1 to 4 times per year    Marital Status: Divorced  Catering manager Violence: Not At Risk (02/17/2024)   Humiliation, Afraid, Rape, and Kick questionnaire    Fear of Current or Ex-Partner: No    Emotionally Abused: No    Physically Abused: No    Sexually Abused: No    Family History  Problem Relation Age of Onset   Diabetes Mother    Hyperlipidemia Mother    Eclampsia Mother    Seizures Mother        died of seizure   Asthma Sister    Hypertension Sister    Diabetes Sister    Cancer Maternal Grandmother    Hypertension Maternal Grandmother     Hypertension Father    Gout Father    Hypertension Maternal Grandfather    Hypertension Paternal Grandmother    Heart disease Neg Hx      Current Outpatient Medications:    Multiple Vitamins-Minerals (MULTIVITAMIN WITH MINERALS) tablet, Take 1 tablet by mouth daily., Disp: , Rfl:   Allergies  Allergen Reactions   Aspirin Hives    hives   Shellfish Allergy Hives    History reviewed: allergies, current medications, past family history, past medical history, past social  history, past surgical history and problem list  Chronic issues discussed: Got hearing aids in 2024, doing ok with this  Acute issues discussed: none    Objective:     Biometrics BP 110/64   Pulse 86   Ht 5' 3.5" (1.613 m)   Wt 184 lb (83.5 kg)   SpO2 98%   BMI 32.08 kg/m   Cognitive Testing  Alert? Yes  Normal Appearance?Yes  Oriented to person? Yes  Place? Yes   Time? Yes  Recall of three objects?  Yes  Can perform simple calculations? Yes  Displays appropriate judgment?Yes  Can read the correct time from a watch face?Yes  General appearance: alert, no distress, WD/WN, African American female  Nutritional Status: Inadequate calore intake? no Loss of muscle mass? no Loss of fat beneath skin? no Localized or general edema? no Diminished functional status? no  Other pertinent exam: HEENT: normocephalic, sclerae anicteric, TMs pearly, nares patent, no discharge or erythema, pharynx normal Oral cavity: MMM, no lesions Neck: supple, no lymphadenopathy, no thyromegaly, no masses Heart: RRR, normal S1, S2, no murmurs Lungs: CTA bilaterally, no wheezes, rhonchi, or rales Abdomen: +bs, soft, non tender, non distended, no masses, no hepatomegaly, no splenomegaly Musculoskeletal: nontender, no swelling, no obvious deformity Extremities: no edema, no cyanosis, no clubbing Pulses: 2+ symmetric, upper and lower extremities, normal cap refill Neurological: alert, oriented x 3, CN2-12 intact, strength  normal upper extremities and lower extremities, sensation normal throughout, DTRs 2+ throughout, no cerebellar signs, gait normal Psychiatric: normal affect, behavior normal, pleasant    EKG reviewed   Assessment:   Encounter Diagnoses  Name Primary?   Welcome to Medicare preventive visit Yes   Postmenopausal estrogen deficiency    Osteopenia, unspecified location    Need for pneumococcal 20-valent conjugate vaccination    Does use hearing aid    Encounter for lipid screening for cardiovascular disease    Vaccine counseling    Encounter for health maintenance examination    Screening for heart disease      Plan:   A preventative services visit was completed today.  During the course of the visit today, we discussed and counseled about appropriate screening and preventive services.  A health risk assessment was established today that included a review of current medications, allergies, social history, family history, medical and preventative health history, biometrics, and preventative screenings to identify potential safety concerns or impairments.  A personalized plan was printed today for your records and use.  Personalized health advice and education was given today to reduce health risks and promote self management and wellness.  Information regarding end of life planning was discussed today.   Conditions/risks identified: Hearing loss  Chronic problems discussed today: Hearing loss, using hearing aids, doing fine  Osteopenia - counseled on weight bearing and aerobic exercise, calcium and vit D supplement  Acute problems discussed today: none  Recommendations: I recommend a yearly ophthalmology/optometry visit for glaucoma screening and eye checkup I recommended a yearly dental visit for hygiene and checkup Advanced directives - discussed nature and purpose of Advanced Directives, encouraged them to complete them if they have not done so and/or encouraged them to get  [place] a copy if they have done this already.   Referrals today: none  Immunizations: Counseled on the pneumococcal vaccine.  Vaccine information sheet given.  Pneumococcal vaccine Prevnar 20 given after consent obtained.  [place] was seen today for welcome to medicare.  Diagnoses and all orders for this visit:  Welcome to [facility]  preventive visit  Postmenopausal estrogen deficiency -     DG Bone Density; Future  Osteopenia, unspecified location -     DG Bone Density; Future  Need for pneumococcal 20-valent conjugate vaccination  Does use hearing aid  Encounter for lipid screening for cardiovascular disease -     Lipid panel  Vaccine counseling  Encounter for health maintenance examination -     Comprehensive metabolic panel -     CBC -     Lipid panel -     TSH -     EKG 12-Lead  Screening for heart disease -     EKG 12-Lead      Medicare Attestation A preventative services visit was completed today.  During the course of the visit the patient was educated and counseled about appropriate screening and preventive services.  A health risk assessment was established with the patient that included a review of current medications, allergies, social history, family history, medical and preventative health history, biometrics, and preventative screenings to identify potential safety concerns or impairments.  A personalized plan was printed today for the patient's records and use.   Personalized health advice and education was given today to reduce health risks and promote self management and wellness.  Information regarding end of life planning was discussed today.  Kristian Covey, PA-C   02/17/2024

## 2024-02-17 NOTE — Patient Instructions (Signed)
 This visit was a preventative care visit, also known as wellness visit or routine physical.   Topics typically include healthy lifestyle, diet, exercise, preventative care, vaccinations, sick and well care, proper use of emergency dept and after hours care, as well as other concerns.     Recommendations: Continue to return yearly for your annual wellness and preventative care visits.  This gives [place] a chance to discuss healthy lifestyle, exercise, vaccinations, review your chart record, and perform screenings where appropriate.  I recommend you see your eye doctor yearly for routine vision care.  I recommend you see your dentist yearly for routine dental care including hygiene visits twice yearly.   Vaccination recommendations were reviewed Immunization History  Administered Date(s) Administered   Hepatitis B 05/05/2000, 06/02/2000, 12/15/2000   Influenza,inj,Quad PF,6+ Mos 10/13/2018, 09/28/2019   Influenza-Unspecified 10/26/2020, 09/30/2022, 10/10/2023   MMR 02/13/1999   Measles 02/22/1971   PFIZER(Purple Top)SARS-COV-2 Vaccination 02/17/2020, 03/13/2020, 11/06/2020   PPD Test 06/13/1999, 05/02/2000, 12/15/2000, 12/17/2001, 01/01/2003, 01/23/2004, 01/30/2005, 01/17/2006, 01/06/2007, 01/17/2015   Pfizer Covid-19 Vaccine Bivalent Booster 40yrs & up 09/24/2021   Pneumococcal Polysaccharide-23 05/02/2021   Td 09/24/1996   Tdap 01/17/2006, 08/01/2009, 10/16/2020   Varicella 05/02/2000   Zoster Recombinant(Shingrix) 06/13/2020, 08/21/2020    We updated Prevnar 20 vaccine today   Screening for cancer: Colon cancer screening: Up to date  Breast cancer screening: You should perform a self breast exam monthly.   Up to date through gynecology  Cervical cancer screening: Up to date through gynecology  Skin cancer screening: Check your skin regularly for new changes, growing lesions, or other lesions of concern Come in for evaluation if you have skin lesions of concern.  Lung  cancer screening: If you have a greater than 20 pack year history of tobacco use, then you may qualify for lung cancer screening with a chest CT scan.   Please call your insurance company to inquire about coverage for this test.  We currently don't have screenings for other cancers besides breast, cervical, colon, and lung cancers.  If you have a strong family history of cancer or have other cancer screening concerns, please let me know.    Bone health: Get at least 150 minutes of aerobic exercise weekly Get weight bearing exercise at least once weekly Bone density test:  A bone density test is an imaging test that uses a type of X-ray to measure the amount of calcium and other minerals in your bones. The test may be used to diagnose or screen you for a condition that causes weak or thin bones (osteoporosis), predict your risk for a broken bone (fracture), or determine how well your osteoporosis treatment is working. The bone density test is recommended for females 65 and older, or females or males <65 if certain risk factors such as thyroid disease, long term use of steroids such as for asthma or rheumatological issues, vitamin D deficiency, estrogen deficiency, family history of osteoporosis, self or family history of fragility fracture in first degree relative.    Heart health: Get at least 150 minutes of aerobic exercise weekly Limit alcohol It is important to maintain a healthy blood pressure and healthy cholesterol numbers  Heart disease screening: Screening for heart disease includes screening for blood pressure, fasting lipids, glucose/diabetes screening, BMI height to weight ratio, reviewed of smoking status, physical activity, and diet.    Goals include blood pressure 120/80 or less, maintaining a healthy lipid/cholesterol profile, preventing diabetes or keeping diabetes numbers under good control, not smoking or  using tobacco products, exercising most days per week or at least 150  minutes per week of exercise, and eating healthy variety of fruits and vegetables, healthy oils, and avoiding unhealthy food choices like fried food, fast food, high sugar and high cholesterol foods.     Medical care options: I recommend you continue to seek care here first for routine care.  We try really hard to have available appointments Monday through Friday daytime hours for sick visits, acute visits, and physicals.  Urgent care should be used for after hours and weekends for significant issues that cannot wait till the next day.  The emergency department should be used for significant potentially life-threatening emergencies.  The emergency department is expensive, can often have long wait times for less significant concerns, so try to utilize primary care, urgent care, or telemedicine when possible to avoid unnecessary trips to the emergency department.  Virtual visits and telemedicine have been introduced since the pandemic started in 2020, and can be convenient ways to receive medical care.  We offer virtual appointments as well to assist you in a variety of options to seek medical care.   Advanced Directives: I recommend you consider completing a Health Care Power of Attorney and Living Will.   These documents respect your wishes and help alleviate burdens on your loved ones if you were to become terminally ill or be in a position to need those documents enforced.    You can complete Advanced Directives yourself, have them notarized, then have copies made for our office, for you and for anybody you feel should have them in safe keeping.  Or, you can have an attorney prepare these documents.   If you haven't updated your Last Will and Testament in a while, it may be worthwhile having an attorney prepare these documents together and save on some costs.

## 2024-02-18 ENCOUNTER — Ambulatory Visit: Payer: Self-pay | Admitting: Medical

## 2024-02-18 LAB — COMPREHENSIVE METABOLIC PANEL
ALT: 13 IU/L (ref 0–32)
AST: 20 IU/L (ref 0–40)
Albumin: 4.2 g/dL (ref 3.9–4.9)
Alkaline Phosphatase: 67 IU/L (ref 44–121)
BUN/Creatinine Ratio: 21 (ref 12–28)
BUN: 14 mg/dL (ref 8–27)
Bilirubin Total: 0.3 mg/dL (ref 0.0–1.2)
CO2: 23 mmol/L (ref 20–29)
Calcium: 9.9 mg/dL (ref 8.7–10.3)
Chloride: 103 mmol/L (ref 96–106)
Creatinine, Ser: 0.68 mg/dL (ref 0.57–1.00)
Globulin, Total: 3 g/dL (ref 1.5–4.5)
Glucose: 72 mg/dL (ref 70–99)
Potassium: 4.3 mmol/L (ref 3.5–5.2)
Sodium: 142 mmol/L (ref 134–144)
Total Protein: 7.2 g/dL (ref 6.0–8.5)
eGFR: 97 mL/min/{1.73_m2} (ref >59)

## 2024-02-18 LAB — CBC
Hematocrit: 42.9 % (ref 34.0–46.6)
Hemoglobin: 13.5 g/dL (ref 11.1–15.9)
MCH: 27.1 pg (ref 26.6–33.0)
MCHC: 31.5 g/dL (ref 31.5–35.7)
MCV: 86 fL (ref 79–97)
Platelets: 263 10*3/uL (ref 150–450)
RBC: 4.99 x10E6/uL (ref 3.77–5.28)
RDW: 12.8 % (ref 11.7–15.4)
WBC: 8.3 10*3/uL (ref 3.4–10.8)

## 2024-02-18 LAB — LIPID PANEL
Cholesterol, Total: 188 mg/dL (ref 100–199)
HDL: 66 mg/dL (ref >39)
LDL CALC COMMENT:: 2.8 ratio (ref 0.0–4.4)
LDL Chol Calc (NIH): 111 mg/dL — ABNORMAL HIGH (ref 0–99)
Triglycerides: 60 mg/dL (ref 0–149)
VLDL Cholesterol Cal: 11 mg/dL (ref 5–40)

## 2024-02-18 LAB — TSH: TSH: 0.878 u[IU]/mL (ref 0.450–4.500)

## 2024-02-18 NOTE — Progress Notes (Signed)
 Results sent through MyChart

## 2024-02-18 NOTE — Telephone Encounter (Signed)
 Chief Complaint: body aches Symptoms: body aches and stiffness, chills, fever, arm pain Frequency: after receiving pneumonia vaccine yesterday  Pertinent Negatives: Patient denies CP, difficulty breathing, weakness/numbness  Pt reports body aches with stiffness and chills after receiving the pneumonia vaccine yesterday at her office visit. Pt states she was not experiencing any symptoms prior to receiving the vaccine. Pt also endorses pain in the arm she received the vaccine in, states she is barely able to lift it. Endorses 5/10 generalized body aches. Has not taken anything for pain yet. States she has not checked her temperature but has chills and feels she has a fever. Denies CP, difficulty breathing, numbness. Still able to walk. Pt went swimming today to try to work out her muscles and feel better but states it did not work. RN called the CAL for advice, CAL advised RN to route a message to the provider and office for follow-up. RN advised pt to call 911 for SOB, CP, weakness, numbness, and difficulty walking, to which she verbalized understanding.    Copied from CRM [id number]. Topic: Clinical - Red Word Triage >> Feb 18, 2024  3:58 PM Fredrich Romans wrote: Red Word that prompted transfer to Nurse Triage: chills ,can't walk,stiff body ,body aches,after pneumonia shot on yesterday Answer Assessment - Initial Assessment Questions 1. ONSET: "When did the muscle aches or body pains start?"      After receiving pneumonia vaccine yesterday 2. LOCATION: "What part of your body is hurting?" (e.g., entire body, arms, legs)      Generalized body aches, can't lift arm where she received shot 3. SEVERITY: "How bad is the pain?" (Scale 1-10; or mild, moderate, severe)   - MILD (1-3): doesn't interfere with normal activities    - MODERATE (4-7): interferes with normal activities or awakens from sleep    - SEVERE (8-10):  excruciating pain, unable to do any normal activities      5/10 4. CAUSE: "What do you think  is causing the pains?"     Pneumonia vaccine 5. FEVER: "Have you been having fever?"     Chills 6. OTHER SYMPTOMS: "Do you have any other symptoms?" (e.g., chest pain, weakness, rash, cold or flu symptoms, weight loss)     Sluggish, didn't sleep well, "I wanted to press on", chills, shaking, had wellness check in the office yesterday and received pneumonia vaccine, states that area is tender, states her other arm hurts well. "I can barely lift that arm." Endorses she is "taking her time" with walking. Endorses stiffness. Pt went swimming trying to feel better. Tingling when she swallows.  Protocols used: Muscle Aches and Body Pain-A-AH

## 2024-02-18 NOTE — Telephone Encounter (Signed)
 Pt was notified.

## 2024-02-18 NOTE — Telephone Encounter (Signed)
**Note De-identified  [place] Obfuscation** Please advise 

## 2024-05-14 ENCOUNTER — Telehealth: Payer: Self-pay | Admitting: Internal Medicine

## 2024-05-14 DIAGNOSIS — M8580 Other specified disorders of bone density and structure, unspecified site: Secondary | ICD-10-CM

## 2024-05-14 NOTE — Telephone Encounter (Signed)
 Patient wanted Bone Density test ordered. I have placed order and sent to [facility]

## 2024-05-14 NOTE — Telephone Encounter (Signed)
 Please advise  Copied from CRM [id number]. Topic: Clinical - Request for Lab/Test Order >> May 14, 2024  8:55 AM Loreda Rodriguez T wrote: Reason for CRM: patient called to get the name of the test Dr Azalea Lento wanted her to get back in Feb when she came in for her physical. Please f/u with patient

## 2024-08-11 DIAGNOSIS — Z1231 Encounter for screening mammogram for malignant neoplasm of breast: Secondary | ICD-10-CM | POA: Diagnosis not present

## 2024-08-11 DIAGNOSIS — Z01419 Encounter for gynecological examination (general) (routine) without abnormal findings: Secondary | ICD-10-CM | POA: Diagnosis not present

## 2024-09-20 ENCOUNTER — Ambulatory Visit: Payer: Self-pay | Admitting: Medical

## 2024-09-20 ENCOUNTER — Ambulatory Visit (HOSPITAL_BASED_OUTPATIENT_CLINIC_OR_DEPARTMENT_OTHER)
Admission: RE | Admit: 2024-09-20 | Discharge: 2024-09-20 | Disposition: A | Discharge status: Home / Self Care | Payer: Self-pay | Source: Ambulatory Visit | Attending: Medical | Admitting: Medical

## 2024-09-20 DIAGNOSIS — M8580 Other specified disorders of bone density and structure, unspecified site: Secondary | ICD-10-CM | POA: Insufficient documentation

## 2024-09-20 DIAGNOSIS — Z13820 Encounter for screening for osteoporosis: Secondary | ICD-10-CM | POA: Diagnosis not present

## 2024-09-20 DIAGNOSIS — Z780 Asymptomatic menopausal state: Secondary | ICD-10-CM | POA: Diagnosis not present

## 2024-09-20 DIAGNOSIS — M8589 Other specified disorders of bone density and structure, multiple sites: Secondary | ICD-10-CM | POA: Insufficient documentation

## 2024-09-20 DIAGNOSIS — M85832 Other specified disorders of bone density and structure, left forearm: Secondary | ICD-10-CM | POA: Diagnosis not present

## 2024-09-20 NOTE — Progress Notes (Signed)
 Results thru my chart

## 2024-09-24 NOTE — Progress Notes (Signed)
 Results thru my chart
